# Patient Record
Sex: Male | Born: 1990 | Race: Black or African American | Hispanic: No | Marital: Single | Smoking: Current every day smoker
Health system: Southern US, Community
[De-identification: ages and names within clinical notes are randomized; demographics above are authoritative.]

---
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
## 2012-03-29 ENCOUNTER — Emergency Department: Payer: Self-pay | Admitting: Emergency Medicine

## 2012-07-23 ENCOUNTER — Emergency Department: Payer: Self-pay | Admitting: Urology

## 2012-07-23 LAB — RAPID INFLUENZA A&B ANTIGENS

## 2012-10-17 ENCOUNTER — Emergency Department: Payer: Self-pay | Admitting: Emergency Medicine

## 2015-09-09 ENCOUNTER — Emergency Department: Payer: Self-pay

## 2015-09-09 ENCOUNTER — Encounter: Payer: Self-pay | Admitting: Emergency Medicine

## 2015-09-09 ENCOUNTER — Emergency Department
Admission: EM | Admit: 2015-09-09 | Discharge: 2015-09-09 | Disposition: A | Discharge status: Home / Self Care | Payer: Self-pay | Attending: Emergency Medicine | Admitting: Emergency Medicine

## 2015-09-09 DIAGNOSIS — W34010A Accidental discharge of airgun, initial encounter: Secondary | ICD-10-CM | POA: Insufficient documentation

## 2015-09-09 DIAGNOSIS — Y998 Other external cause status: Secondary | ICD-10-CM | POA: Insufficient documentation

## 2015-09-09 DIAGNOSIS — Y9389 Activity, other specified: Secondary | ICD-10-CM | POA: Insufficient documentation

## 2015-09-09 DIAGNOSIS — Y249XXA Unspecified firearm discharge, undetermined intent, initial encounter: Secondary | ICD-10-CM

## 2015-09-09 DIAGNOSIS — W3400XA Accidental discharge from unspecified firearms or gun, initial encounter: Secondary | ICD-10-CM

## 2015-09-09 DIAGNOSIS — F17210 Nicotine dependence, cigarettes, uncomplicated: Secondary | ICD-10-CM | POA: Insufficient documentation

## 2015-09-09 DIAGNOSIS — Y9289 Other specified places as the place of occurrence of the external cause: Secondary | ICD-10-CM | POA: Insufficient documentation

## 2015-09-09 DIAGNOSIS — S91331A Puncture wound without foreign body, right foot, initial encounter: Secondary | ICD-10-CM

## 2015-09-09 MED ORDER — CEPHALEXIN 500 MG PO CAPS: 500.0000 mg | ORAL_CAPSULE | Freq: Three times a day (TID) | ORAL | Status: DC

## 2015-09-09 MED ORDER — HYDROCODONE-ACETAMINOPHEN 5-325 MG PO TABS: 1.0000 | ORAL_TABLET | ORAL | Status: DC | PRN

## 2015-09-09 MED ORDER — HYDROCODONE-ACETAMINOPHEN 5-325 MG PO TABS
2.0000 | ORAL_TABLET | Freq: Once | ORAL | Status: AC
Start: 2015-09-09 — End: 2015-09-09
  Administered 2015-09-09: 2 via ORAL
  Filled 2015-09-09: qty 2
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
## 2015-09-09 NOTE — Discharge Instructions (Signed)
Puncture Wound °A puncture wound is an injury that is caused by a sharp, thin object that goes through your skin, such as a nail. A puncture wound usually does not leave a large opening in your skin, so it may not bleed a lot. However, when you get a puncture wound, dirt or other materials (foreign bodies) can be forced into your wound and break off inside. This makes it more likely that an infection will happen, such as tetanus. °HOME CARE °Medicines  °· Take or apply over-the-counter and prescription medicines only as told by your doctor. °· If you were prescribed an antibiotic medicine, take or apply it as told by your doctor. Do not stop using the antibiotic even if your condition starts to get better. °Wound Care  °· There are many ways to close and cover a wound. For example, a wound can be covered with stitches (sutures), skin glue, or adhesive strips. Follow instructions from your doctor about: °¨ How to take care of your wound. °¨ When and how you should change your bandage (dressing). °¨ When you should remove your bandage. °¨ Removing whatever was used to close your wound. °· Keep the bandage dry as told by your doctor. Do not take baths, swim, use a hot tub, or do anything that would put your wound underwater until your doctor says it is okay. °· Clean the wound as told by your doctor. °· Do not scratch or pick at the wound. °· Check your wound every day for signs of infection. Watch for: °¨ Redness, swelling, or pain. °¨ Fluid, blood, or pus. °General Instructions  °· Raise (elevate) the injured area above the level of your heart while you are sitting or lying down. °· If your puncture wound is in your foot, ask your doctor if you need to avoid putting weight on your foot and for how long. °· Keep all follow-up visits as told by your doctor. This is important. °GET HELP IF: °· You got a tetanus shot and you have any of these problems at the injection site: °¨ Swelling. °¨ Very bad  pain. °¨ Redness. °¨ Bleeding. °· You have a fever. °· Your stitches come out. °· You notice a bad smell coming from your wound or your bandage. °· You notice something coming out of the wound, such as wood or glass. °· Medicine does not help your pain. °· You have more redness, swelling, or pain at the site of your wound. °· You have fluid, blood, or pus coming from your wound. °· You notice a change in the color of your skin near your wound. °· You need to change the bandage often because fluid, blood, or pus is coming from the wound. °· You start to have a new rash. °· You start to have numbness around the wound. °GET HELP RIGHT AWAY IF: °· You have very bad swelling around the wound. °· Your pain suddenly gets worse and is very bad. °· You start to get painful skin lumps. °· You have a red streak going away from your wound. °· The wound is on your hand or foot and you cannot move a finger or toe like you usually can. °· The wound is on your hand or foot and you notice that your fingers or toes look pale or bluish. °  °This information is not intended to replace advice given to you by your health care provider. Make sure you discuss any questions you have with your health care provider. °  °Document   Released: 03/26/2008 Document Revised: 03/08/2015 Document Reviewed: 08/10/2014 Elsevier Interactive Patient Education 2016 [employer]   Soak her foot in warm soapy water twice a day and watch for signs of infection. Begin medication today. Keflex for infection prevention and Norco as needed for pain. Elevate foot to prevent swelling and decreased pain. Follow-up with Dr. Ether GriffinsFowler if any continued problems.
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
## 2015-09-09 NOTE — ED Notes (Signed)
Per flex provider, pt's foot is soaking in a half betadine half sterile water solution for cleaning. RN has been notified
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
## 2015-09-30 ENCOUNTER — Emergency Department
Admission: EM | Admit: 2015-09-30 | Discharge: 2015-09-30 | Disposition: A | Discharge status: Home / Self Care | Payer: Self-pay | Attending: Emergency Medicine | Admitting: Emergency Medicine

## 2015-09-30 ENCOUNTER — Encounter: Payer: Self-pay | Admitting: Emergency Medicine

## 2015-09-30 DIAGNOSIS — F17210 Nicotine dependence, cigarettes, uncomplicated: Secondary | ICD-10-CM | POA: Insufficient documentation

## 2015-09-30 DIAGNOSIS — K0889 Other specified disorders of teeth and supporting structures: Secondary | ICD-10-CM | POA: Insufficient documentation

## 2015-09-30 DIAGNOSIS — K029 Dental caries, unspecified: Secondary | ICD-10-CM | POA: Insufficient documentation

## 2015-09-30 DIAGNOSIS — Z792 Long term (current) use of antibiotics: Secondary | ICD-10-CM | POA: Insufficient documentation

## 2015-09-30 DIAGNOSIS — K0381 Cracked tooth: Secondary | ICD-10-CM | POA: Insufficient documentation

## 2015-09-30 MED ORDER — OXYCODONE-ACETAMINOPHEN 5-325 MG PO TABS
1.0000 | ORAL_TABLET | Freq: Once | ORAL | Status: AC
  Administered 2015-09-30: 1 via ORAL
  Filled 2015-09-30: qty 1

## 2015-09-30 MED ORDER — IBUPROFEN 800 MG PO TABS
800.0000 mg | ORAL_TABLET | Freq: Once | ORAL | Status: AC
  Administered 2015-09-30: 800 mg via ORAL

## 2015-09-30 MED ORDER — IBUPROFEN 800 MG PO TABS
ORAL_TABLET | ORAL | Status: AC
  Administered 2015-09-30: 800 mg via ORAL
  Filled 2015-09-30: qty 1

## 2015-09-30 MED ORDER — IBUPROFEN 800 MG PO TABS: 800.0000 mg | ORAL_TABLET | Freq: Three times a day (TID) | ORAL | Status: DC | PRN

## 2015-09-30 MED ORDER — AMOXICILLIN 500 MG PO CAPS: 500.0000 mg | ORAL_CAPSULE | Freq: Three times a day (TID) | ORAL | Status: DC

## 2015-09-30 MED ORDER — IBUPROFEN 100 MG/5ML PO SUSP
800.0000 mg | Freq: Once | ORAL | Status: DC
  Filled 2015-09-30: qty 40

## 2015-09-30 MED ORDER — TRAMADOL HCL 50 MG PO TABS
50.0000 mg | ORAL_TABLET | Freq: Four times a day (QID) | ORAL | Status: AC | PRN
Start: 2015-09-30 — End: 2016-09-29

## 2015-09-30 NOTE — Discharge Instructions (Signed)
Follow-up from list of dental clinics provided °

## 2015-09-30 NOTE — ED Notes (Signed)
Discussed discharge instructions, prescriptions, and follow-up care with patient. No questions or concerns at this time. Pt stable at discharge.  

## 2015-09-30 NOTE — ED Provider Notes (Signed)
[facility] Emergency Department Provider Note  ____________________________________________  Time seen: Approximately 8:34 AM  I have reviewed the triage vital signs and the nursing notes.   HISTORY  Chief Complaint Dental Pain    HPI Cory Chan is a 25 y.o. male patient complaining of dental pain for 4 days secondary to devitalize left molar. He is rating his pain as a 10 over 10. Patient described a pain as "sharp". No palliative measures taken for this complaint. Patient denies any fever or swelling associated with this complaint.   No past medical history on file.  There are no active problems to display for this patient.   History reviewed. No pertinent past surgical history.  Current Outpatient Rx  Name  Route  Sig  Dispense  Refill  . amoxicillin (AMOXIL) 500 MG capsule   Oral   Take 1 capsule (500 mg total) by mouth 3 (three) times daily.   30 capsule   0   . cephALEXin (KEFLEX) 500 MG capsule   Oral   Take 1 capsule (500 mg total) by mouth 3 (three) times daily.   21 capsule   0   . HYDROcodone-acetaminophen (NORCO/VICODIN) 5-325 MG tablet   Oral   Take 1 tablet by mouth every 4 (four) hours as needed for moderate pain.   20 tablet   0   . ibuprofen (ADVIL,MOTRIN) 800 MG tablet   Oral   Take 1 tablet (800 mg total) by mouth every 8 (eight) hours as needed.   30 tablet   0   . traMADol (ULTRAM) 50 MG tablet   Oral   Take 1 tablet (50 mg total) by mouth every 6 (six) hours as needed.   20 tablet   0     Allergies Review of patient's allergies indicates no known allergies.  History reviewed. No pertinent family history.  Social History Social History  Substance Use Topics  . Smoking status: Current Every Day Smoker -- 0.25 packs/day    Types: Cigarettes  . Smokeless tobacco: None  . Alcohol Use: No    Review of Systems Constitutional: No fever/chills Eyes: No visual changes. ENT: No sore throat. Dental  pain Cardiovascular: Denies chest pain. Respiratory: Denies shortness of breath. Gastrointestinal: No abdominal pain.  No nausea, no vomiting.  No diarrhea.  No constipation. Genitourinary: Negative for dysuria. Musculoskeletal: Negative for back pain. Skin: Negative for rash. Neurological: Negative for headaches, focal weakness or numbness.   ____________________________________________   PHYSICAL EXAM:  VITAL SIGNS: ED Triage Vitals  Enc Vitals Group     BP 09/30/15 0812 121/90 mmHg     Pulse Rate 09/30/15 0812 68     Resp 09/30/15 0812 20     Temp 09/30/15 0812 98.1 F (36.7 C)     Temp Source 09/30/15 0812 Oral     SpO2 09/30/15 0812 98 %     Weight 09/30/15 0812 180 lb (81.647 kg)     Height 09/30/15 0812  (1.753 m)     Head Cir --      Peak Flow --      Pain Score --      Pain Loc --      Pain Edu? --      Excl. in GC? --     Constitutional: Alert and oriented. Well appearing and in no acute distress. Eyes: Conjunctivae are normal. PERRL. EOMI. Head: Atraumatic. Nose: No congestion/rhinnorhea. Mouth/Throat: Mucous membranes are moist.  Oropharynx non-erythematous. Fractured tooth #18. Neck:  No stridor.  No cervical spine tenderness to palpation. Hematological/Lymphatic/Immunilogical: No cervical lymphadenopathy. Cardiovascular: Normal rate, regular rhythm. Grossly normal heart sounds.  Good peripheral circulation. Respiratory: Normal respiratory effort.  No retractions. Lungs CTAB. Gastrointestinal: Soft and nontender. No distention. No abdominal bruits. No CVA tenderness. Musculoskeletal: No lower extremity tenderness nor edema.  No joint effusions. Neurologic:  Normal speech and language. No gross focal neurologic deficits are appreciated. No gait instability. Skin:  Skin is warm, dry and intact. No rash noted. Psychiatric: Mood and affect are normal. Speech and behavior are normal.  ____________________________________________   LABS (all labs  ordered are listed, but only abnormal results are displayed)  Labs Reviewed - No data to display ____________________________________________  EKG   ____________________________________________  RADIOLOGY   ____________________________________________   PROCEDURES  Procedure(s) performed: None  Critical Care performed: No  ____________________________________________   INITIAL IMPRESSION / ASSESSMENT AND PLAN / ED COURSE  Pertinent labs & imaging results that were available during my care of the patient were reviewed by me and considered in my medical decision making (see chart for details).  Dental pain secondary to fractured tooth left lower molar. Patient given discharge care instructions to follow-up with [facility]. Patient given prescription for amoxicillin, tramadol, and ibuprofen. ____________________________________________   FINAL CLINICAL IMPRESSION(S) / ED DIAGNOSES  Final diagnoses:  Pain due to dental caries      Joni Reiningonald K Lachanda Buczek, PA-C 09/30/15 [id number]  Arnaldo NatalPaul F Malinda, MD 09/30/15 [phone]

## 2015-09-30 NOTE — ED Notes (Signed)
Reports toothache x 4 days.  No resp distress.

## 2016-07-12 ENCOUNTER — Encounter: Payer: Self-pay | Admitting: Emergency Medicine

## 2016-07-12 ENCOUNTER — Emergency Department
Admission: EM | Admit: 2016-07-12 | Discharge: 2016-07-13 | Disposition: A | Discharge status: Home / Self Care | Payer: Self-pay | Attending: Emergency Medicine | Admitting: Emergency Medicine

## 2016-07-12 DIAGNOSIS — K529 Noninfective gastroenteritis and colitis, unspecified: Secondary | ICD-10-CM | POA: Insufficient documentation

## 2016-07-12 DIAGNOSIS — Z87891 Personal history of nicotine dependence: Secondary | ICD-10-CM | POA: Insufficient documentation

## 2016-07-12 LAB — CBC
HEMATOCRIT: 47.5 % (ref 40.0–52.0)
Hemoglobin: 15.7 g/dL (ref 13.0–18.0)
MCH: 26.7 pg (ref 26.0–34.0)
MCHC: 33 g/dL (ref 32.0–36.0)
MCV: 80.8 fL (ref 80.0–100.0)
Platelets: 290 10*3/uL (ref 150–440)
RBC: 5.88 MIL/uL (ref 4.40–5.90)
RDW: 13.7 % (ref 11.5–14.5)
WBC: 11.3 10*3/uL — ABNORMAL HIGH (ref 3.8–10.6)

## 2016-07-12 LAB — URINALYSIS, COMPLETE (UACMP) WITH MICROSCOPIC
BACTERIA UA: NONE SEEN
BILIRUBIN URINE: NEGATIVE
Glucose, UA: NEGATIVE mg/dL
Hgb urine dipstick: NEGATIVE
Ketones, ur: NEGATIVE mg/dL
LEUKOCYTES UA: NEGATIVE
Nitrite: NEGATIVE
PROTEIN: NEGATIVE mg/dL
SPECIFIC GRAVITY, URINE: 1.023 (ref 1.005–1.030)
pH: 7 (ref 5.0–8.0)

## 2016-07-12 LAB — COMPREHENSIVE METABOLIC PANEL
ALBUMIN: 4.2 g/dL (ref 3.5–5.0)
ALT: 31 U/L (ref 17–63)
AST: 22 U/L (ref 15–41)
Alkaline Phosphatase: 90 U/L (ref 38–126)
Anion gap: 10 (ref 5–15)
BUN: 9 mg/dL (ref 6–20)
CHLORIDE: 106 mmol/L (ref 101–111)
CO2: 23 mmol/L (ref 22–32)
Calcium: 9.1 mg/dL (ref 8.9–10.3)
Creatinine, Ser: 0.93 mg/dL (ref 0.61–1.24)
GFR calc Af Amer: >60 mL/min (ref >60)
GFR calc non Af Amer: >60 mL/min (ref >60)
GLUCOSE: 116 mg/dL — AB (ref 65–99)
POTASSIUM: 3.8 mmol/L (ref 3.5–5.1)
Sodium: 139 mmol/L (ref 135–145)
Total Bilirubin: 0.6 mg/dL (ref 0.3–1.2)
Total Protein: 8 g/dL (ref 6.5–8.1)

## 2016-07-12 LAB — LIPASE, BLOOD: LIPASE: 14 U/L (ref 11–51)

## 2016-07-12 MED ORDER — SODIUM CHLORIDE 0.9 % IV BOLUS (SEPSIS)
1000.0000 mL | Freq: Once | INTRAVENOUS | Status: AC
  Administered 2016-07-13: 1000 mL via INTRAVENOUS

## 2016-07-12 NOTE — ED Triage Notes (Signed)
Pt ambulatory to triage in NAD, report abd pain and vomiting and diarrhea x 6 hours.  Reports diarrhea all day, too many times to count.  Pt reports abd pain like cramping.

## 2016-07-13 MED ORDER — ONDANSETRON 4 MG PO TBDP: 4.0000 mg | ORAL_TABLET | Freq: Three times a day (TID) | ORAL | 0 refills | Status: DC | PRN

## 2016-07-13 MED ORDER — LOPERAMIDE HCL 2 MG PO CAPS
4.0000 mg | ORAL_CAPSULE | Freq: Once | ORAL | Status: AC
  Administered 2016-07-13: 4 mg via ORAL
  Filled 2016-07-13: qty 2

## 2016-07-13 MED ORDER — SODIUM CHLORIDE 0.9 % IV BOLUS (SEPSIS)
1000.0000 mL | Freq: Once | INTRAVENOUS | Status: AC
  Administered 2016-07-13: 1000 mL via INTRAVENOUS

## 2016-07-13 MED ORDER — ONDANSETRON HCL 4 MG/2ML IJ SOLN
4.0000 mg | Freq: Once | INTRAMUSCULAR | Status: AC
  Administered 2016-07-13: 4 mg via INTRAVENOUS
  Filled 2016-07-13: qty 2

## 2016-07-13 MED ORDER — SODIUM CHLORIDE 0.9 % IV BOLUS (SEPSIS): 1000.0000 mL | Freq: Once | INTRAVENOUS | Status: DC

## 2016-07-13 NOTE — ED Provider Notes (Signed)
[facility] Emergency Department Provider Note    First MD Initiated Contact with Patient 07/13/16 2345     (approximate)  I have reviewed the triage vital signs and the nursing notes.   HISTORY  Chief Complaint Abdominal Pain and Emesis   HPI Cory Chan is a 26 y.o. male presents with acute onset of vomiting and diarrhea following eating a hot dog today. Patient states that he's had multiple episodes of nonbloody emesis and diarrhea. Patient also admits to abdominal cramping. Patient states symptoms started abruptly shortly after eating a hot dog.   Past medical history None There are no active problems to display for this patient.   Past surgical history None  Prior to Admission medications   Medication Sig Start Date End Date Taking? Authorizing Provider  amoxicillin (AMOXIL) 500 MG capsule Take 1 capsule (500 mg total) by mouth 3 (three) times daily. 09/30/15   Joni Reiningonald K Smith, PA-C  cephALEXin (KEFLEX) 500 MG capsule Take 1 capsule (500 mg total) by mouth 3 (three) times daily. 09/09/15   Tommi Rumpshonda L Summers, PA-C  HYDROcodone-acetaminophen (NORCO/VICODIN) 5-325 MG tablet Take 1 tablet by mouth every 4 (four) hours as needed for moderate pain. 09/09/15   Tommi Rumpshonda L Summers, PA-C  ibuprofen (ADVIL,MOTRIN) 800 MG tablet Take 1 tablet (800 mg total) by mouth every 8 (eight) hours as needed. 09/30/15   Joni Reiningonald K Smith, PA-C  traMADol (ULTRAM) 50 MG tablet Take 1 tablet (50 mg total) by mouth every 6 (six) hours as needed. 09/30/15 09/29/16  Joni Reiningonald K Smith, PA-C    Allergies Patient has no known allergies.  History reviewed. No pertinent family history.  Social History Social History  Substance Use Topics  . Smoking status: Former Smoker    Packs/day: 0.25    Types: Cigarettes  . Smokeless tobacco: Never Used  . Alcohol use No    Review of Systems Constitutional: No fever/chills Eyes: No visual changes. ENT: No sore throat. Cardiovascular: Denies  chest pain. Respiratory: Denies shortness of breath. Gastrointestinal: No abdominal pain.  No nausea, no vomiting.  No diarrhea.  No constipation. Genitourinary: Negative for dysuria. Musculoskeletal: Negative for back pain. Skin: Negative for rash. Neurological: Negative for headaches, focal weakness or numbness.  10-point ROS otherwise negative.  ____________________________________________   PHYSICAL EXAM:  VITAL SIGNS: ED Triage Vitals  Enc Vitals Group     BP 07/12/16 2251 132/81     Pulse Rate 07/12/16 2251 86     Resp 07/12/16 2251 14     Temp 07/12/16 2251 97.5 F (36.4 C)     Temp Source 07/12/16 2251 Oral     SpO2 07/12/16 2251 99 %     Weight 07/12/16 2251 180 lb (81.6 kg)     Height 07/12/16 2251 5\' 9"  (1.753 m)     Head Circumference --      Peak Flow --      Pain Score 07/12/16 2254 8     Pain Loc --      Pain Edu? --      Excl. in GC? --      Constitutional: Alert and oriented. Well appearing and in no acute distress. Eyes: Conjunctivae are normal. PERRL. EOMI. Head: Atraumatic. Mouth/Throat: Mucous membranes are moist.  Oropharynx non-erythematous. Neck: No stridor.   Cardiovascular: Normal rate, regular rhythm. Good peripheral circulation. Grossly normal heart sounds. Respiratory: Normal respiratory effort.  No retractions. Lungs CTAB. Gastrointestinal: Soft and nontender. No distention.  Musculoskeletal: No lower extremity tenderness  nor edema. No gross deformities of extremities. Neurologic:  Normal speech and language. No gross focal neurologic deficits are appreciated.  Skin:  Skin is warm, dry and intact. No rash noted. Psychiatric: Mood and affect are normal. Speech and behavior are normal.  ____________________________________________   LABS (all labs ordered are listed, but only abnormal results are displayed)  Labs Reviewed  COMPREHENSIVE METABOLIC PANEL - Abnormal; Notable for the following:       Result Value   Glucose, Bld 116 (*)     All other components within normal limits  CBC - Abnormal; Notable for the following:    WBC 11.3 (*)    All other components within normal limits  URINALYSIS, COMPLETE (UACMP) WITH MICROSCOPIC - Abnormal; Notable for the following:    Color, Urine YELLOW (*)    APPearance CLEAR (*)    Squamous Epithelial / LPF 0-5 (*)    All other components within normal limits  GASTROINTESTINAL PANEL BY PCR, STOOL (REPLACES STOOL CULTURE)  LIPASE, BLOOD     Procedures    INITIAL IMPRESSION / ASSESSMENT AND PLAN / ED COURSE  Pertinent labs & imaging results that were available during my care of the patient were reviewed by me and considered in my medical decision making (see chart for details).  History physical exam consistent with possible gastroenteritis as such patient given 2 L IV normal saline 4 mg IV Zofran. Patient had no further diarrhea while in the emergency department. As of abdominal pain with palpation no imaging of the abdomen was performed   Clinical Course     ____________________________________________  FINAL CLINICAL IMPRESSION(S) / ED DIAGNOSES  Final diagnoses:  Gastroenteritis     MEDICATIONS GIVEN DURING THIS VISIT:  Medications  sodium chloride 0.9 % bolus 1,000 mL (not administered)  sodium chloride 0.9 % bolus 1,000 mL (not administered)  ondansetron (ZOFRAN) injection 4 mg (not administered)  sodium chloride 0.9 % bolus 1,000 mL (not administered)     NEW OUTPATIENT MEDICATIONS STARTED DURING THIS VISIT:  New Prescriptions   No medications on file    Modified Medications   No medications on file    Discontinued Medications   No medications on file     Note:  This document was prepared using Dragon voice recognition software and may include unintentional dictation errors.    Darci Current, MD 07/13/16 [phone]

## 2017-08-12 ENCOUNTER — Emergency Department
Admission: EM | Admit: 2017-08-12 | Discharge: 2017-08-12 | Disposition: A | Discharge status: Home / Self Care | Payer: Self-pay | Attending: Emergency Medicine | Admitting: Emergency Medicine

## 2017-08-12 ENCOUNTER — Other Ambulatory Visit: Payer: Self-pay

## 2017-08-12 DIAGNOSIS — R51 Headache: Secondary | ICD-10-CM | POA: Insufficient documentation

## 2017-08-12 DIAGNOSIS — R519 Headache, unspecified: Secondary | ICD-10-CM

## 2017-08-12 DIAGNOSIS — Z87891 Personal history of nicotine dependence: Secondary | ICD-10-CM | POA: Insufficient documentation

## 2017-08-12 MED ORDER — KETOROLAC TROMETHAMINE 30 MG/ML IJ SOLN
30.0000 mg | Freq: Once | INTRAMUSCULAR | Status: AC
  Administered 2017-08-12: 30 mg via INTRAMUSCULAR
  Filled 2017-08-12: qty 1

## 2017-08-12 MED ORDER — BUTALBITAL-APAP-CAFFEINE 50-325-40 MG PO TABS: 1.0000 | ORAL_TABLET | Freq: Four times a day (QID) | ORAL | 0 refills | Status: AC | PRN

## 2017-08-12 NOTE — ED Provider Notes (Signed)
[facility] Emergency Department Provider Note   ____________________________________________    I have reviewed the triage vital signs and the nursing notes.   HISTORY  Chief Complaint Headache     HPI Cory Chan is a 27 y.o. male who presents with complaints of a frontal headache which has been ongoing over the last 3 days.  Patient reports moderate throbbing sensation in the front of his head.  No photosensitivity.  Does report a history of migraines when he was younger.  No nausea or vomiting.  No head injury.  No neck pain.  No neuro deficits.  No change in vision.  No fever.  Took an Advil yesterday with resolution of pain but then he states the headache came back.  Has not taken any more medication   History reviewed. No pertinent past medical history.  There are no active problems to display for this patient.   History reviewed. No pertinent surgical history.  Prior to Admission medications   Medication Sig Start Date End Date Taking? Authorizing Provider  amoxicillin (AMOXIL) 500 MG capsule Take 1 capsule (500 mg total) by mouth 3 (three) times daily. 09/30/15   Joni Reining, PA-C  butalbital-acetaminophen-caffeine (FIORICET, ESGIC) [phone] MG tablet Take 1-2 tablets by mouth every 6 (six) hours as needed for headache. 08/12/17 08/12/18  Jene Every, MD  cephALEXin (KEFLEX) 500 MG capsule Take 1 capsule (500 mg total) by mouth 3 (three) times daily. 09/09/15   Tommi Rumps, PA-C  HYDROcodone-acetaminophen (NORCO/VICODIN) 5-325 MG tablet Take 1 tablet by mouth every 4 (four) hours as needed for moderate pain. 09/09/15   Tommi Rumps, PA-C  ibuprofen (ADVIL,MOTRIN) 800 MG tablet Take 1 tablet (800 mg total) by mouth every 8 (eight) hours as needed. 09/30/15   Joni Reining, PA-C  ondansetron (ZOFRAN ODT) 4 MG disintegrating tablet Take 1 tablet (4 mg total) by mouth every 8 (eight) hours as needed for nausea or vomiting. 07/13/16    Darci Current, MD     Allergies Patient has no known allergies.  No family history on file.  Social History Social History   Tobacco Use  . Smoking status: Former Smoker    Packs/day: 0.25    Types: Cigarettes  . Smokeless tobacco: Never Used  Substance Use Topics  . Alcohol use: No  . Drug use: Yes    Types: Marijuana    Comment: Daily    Review of Systems  Constitutional: No fevers Eyes: No visual changes.  ENT: No neck pain Cardiovascular: No palpitations Resp: No cough  Gastrointestinal:   No nausea, no vomiting.   Genitourinary: Negative for dysuria. Musculoskeletal: Negative for joint pain Skin: Negative for rash. Neurological: Negative for focal weakness   ____________________________________________   PHYSICAL EXAM:  VITAL SIGNS: ED Triage Vitals [08/12/17 0848]  Enc Vitals Group     BP 138/88     Pulse Rate 66     Resp 17     Temp 98.2 F (36.8 C)     Temp Source Oral     SpO2 98 %     Weight 86.2 kg (190 lb)     Height 1.753 m (5\' 9" )     Head Circumference      Peak Flow      Pain Score 8     Pain Loc      Pain Edu?      Excl. in GC?     Constitutional: Alert and oriented. No  acute distress. Pleasant and interactive Eyes: Conjunctivae are normal.  PERRLA, EOMI Head: Atraumatic.   Neck:  Painless ROM Cardiovascular: Normal rate, regular rhythm.   Good peripheral circulation. Respiratory: Normal respiratory effort.  No retractions.  Gastrointestinal: Soft and nontender. No distention.  Genitourinary: deferred Musculoskeletal:  Warm and well perfused Neurologic:  Normal speech and language. No gross focal neurologic deficits are appreciated.  Skin:  Skin is warm, dry and intact. No rash noted. Psychiatric: Mood and affect are normal. Speech and behavior are normal.  ____________________________________________   LABS (all labs ordered are listed, but only abnormal results are displayed)  Labs Reviewed - No data to  display ____________________________________________  EKG  None ____________________________________________  RADIOLOGY  None ____________________________________________   PROCEDURES  Procedure(s) performed: No  Procedures   Critical Care performed: No ____________________________________________   INITIAL IMPRESSION / ASSESSMENT AND PLAN / ED COURSE  Pertinent labs & imaging results that were available during my care of the patient were reviewed by me and considered in my medical decision making (see chart for details).  Patient well-appearing in no acute distress.  Concerning symptoms on HPI or findings on exam.  Will treat with IM Toradol and reevaluate but suspect outpatient treatment is appropriate    ____________________________________________   FINAL CLINICAL IMPRESSION(S) / ED DIAGNOSES  Final diagnoses:  Acute nonintractable headache, unspecified headache type        Note:  This document was prepared using Dragon voice recognition software and may include unintentional dictation errors.    Jene EveryKinner, Ayomide Zuleta, MD 08/12/17 [phone]

## 2017-08-12 NOTE — ED Notes (Signed)
Patient ambulatory to triage with steady gait and NAD noted. Patient verbalized understanding of discharge instructions and follow-up care.

## 2017-08-12 NOTE — ED Notes (Signed)
Patient ambulatory to Room 2, Cory RiegerLaura RN aware of placement in room.

## 2017-08-12 NOTE — ED Triage Notes (Signed)
Pt c/o frontal HA for the past 3 days, states he took advil yesterday and it went away but came back.

## 2018-02-21 ENCOUNTER — Encounter: Payer: Self-pay | Admitting: Emergency Medicine

## 2018-02-21 ENCOUNTER — Emergency Department
Admission: EM | Admit: 2018-02-21 | Discharge: 2018-02-21 | Disposition: A | Discharge status: Home / Self Care | Payer: Self-pay | Attending: Emergency Medicine | Admitting: Emergency Medicine

## 2018-02-21 ENCOUNTER — Other Ambulatory Visit: Payer: Self-pay

## 2018-02-21 DIAGNOSIS — K047 Periapical abscess without sinus: Secondary | ICD-10-CM | POA: Insufficient documentation

## 2018-02-21 DIAGNOSIS — Z79899 Other long term (current) drug therapy: Secondary | ICD-10-CM | POA: Insufficient documentation

## 2018-02-21 DIAGNOSIS — Z87891 Personal history of nicotine dependence: Secondary | ICD-10-CM | POA: Insufficient documentation

## 2018-02-21 MED ORDER — AMOXICILLIN 875 MG PO TABS: 875.0000 mg | ORAL_TABLET | Freq: Two times a day (BID) | ORAL | 0 refills | Status: DC

## 2018-02-21 MED ORDER — TRAMADOL HCL 50 MG PO TABS: 50.0000 mg | ORAL_TABLET | Freq: Four times a day (QID) | ORAL | 0 refills | Status: DC | PRN

## 2018-02-21 MED ORDER — MAGIC MOUTHWASH W/LIDOCAINE: 5.0000 mL | Freq: Four times a day (QID) | ORAL | 0 refills | Status: DC

## 2018-02-21 NOTE — Discharge Instructions (Signed)
OPTIONS FOR DENTAL FOLLOW UP CARE ° °[place] Department of Health and Human Services - Local Safety Net Dental Clinics °http://www.ncdhhs.gov/dph/oralhealth/services/safetynetclinics.htm °  °[facility] (336-562-3123) ° °[facility] [place] (919-933-9087) ° °[facility] ([phone] ext [phone]) ° °[facility] ([phone]) ° °[facility] ([phone]) °This clinic caters to the indigent population and is on a lottery system. °Location: °[facility], [address], [place] °Clinic Hours: °Wednesdays from 6pm - 9pm, patients seen by a lottery system. °For dates, call or go to [email or web address]/[facility]/patients/[email or web address] °Services: °Cleanings, fillings and simple extractions. °Payment Options: °DENTAL WORK IS FREE OF CHARGE. Bring proof of income or support. °Best way to get seen: °Arrive at 5:15 pm - this is a lottery, NOT first come/first serve, so arriving earlier will not increase your chances of being seen. °  °  °[facility] °[phone] °Select option 1 for emergencies °  °Location: °[facility], [address], [place] °Clinic Hours: °No walk-ins accepted - call the day before to schedule an appointment. °Check in times are 9:30 am and 1:30 pm. °Services: °Simple extractions, temporary fillings, pulpectomy/pulp debridement, uncomplicated abscess drainage. °Payment Options: °PAYMENT IS DUE AT THE TIME OF SERVICE.  Fee is usually $100-200, additional surgical procedures (e.g. abscess drainage) may be extra. °Cash, checks, Visa/MasterCard accepted.  Can file Medicaid if patient is covered for dental - patient should call case worker to check. °No discount for [facility] patients. °Best way to get seen: °MUST call the day before and get onto the schedule. Can usually be seen the next 1-2 days. No walk-ins accepted. °  °  °[facility] °[phone] °   °Location: °[facility], [address], [place] °Clinic Hours: °M, W, Th, F 8am or 1:30pm, Tues 9a or 1:30 - first come/first served. °Services: °Simple extractions, temporary fillings, uncomplicated abscess drainage.  You do not need to be an [place] resident. °Payment Options: °PAYMENT IS DUE AT THE TIME OF SERVICE. °Dental insurance, otherwise sliding scale - bring proof of income or support. °Depending on income and treatment needed, cost is usually $50-200. °Best way to get seen: °Arrive early as it is first come/first served. °  °  °[facility] °[phone] °  °Location: °[address] °Clinic Hours: °Mon-Thu 8a-5p °Services: °Most basic dental services including extractions and fillings. °Payment Options: °PAYMENT IS DUE AT THE TIME OF SERVICE. °Sliding scale, up to 50% off - bring proof if income or support. °Medicaid with dental option accepted. °Best way to get seen: °Call to schedule an appointment, can usually be seen within 2 weeks OR they will try to see walk-ins - show up at 8a or 2p (you may have to wait). °  °  °[facility] °[phone] °[place] RESIDENTS ONLY °  °Location: °[facility], [address], [place], [place] [address] °Clinic Hours: By appointment only. °Monday - Thursday 8am-5pm, Friday 8am-12pm °Services: Cleanings, fillings, extractions. °Payment Options: °PAYMENT IS DUE AT THE TIME OF SERVICE. °Cash, Visa or MasterCard. Sliding scale - $30 minimum per service. °Best way to get seen: °Come in to office, complete packet and make an appointment - need proof of income °or support monies for each household member and proof of [place] residence. °Usually takes about a month to get in. °  °  °[facility] °[phone] °  °Location: °[address]   [place] °Clinic Hours: Walk-in Urgent Care Dental Services are offered Monday-Friday  mornings only. °The numbers of emergencies accepted daily is limited to the number of °providers available. °Maximum 15 - Mondays, Wednesdays & Thursdays °Maximum 10 - Tuesdays & Fridays °Services: °You do not need to be a [place] resident to be seen for a dental emergency. °Emergencies are defined as pain, swelling, abnormal bleeding, or dental trauma. Walkins will receive x-rays if needed. °NOTE: Dental cleaning is not an emergency. °Payment Options: °PAYMENT IS DUE AT THE TIME OF SERVICE. °Minimum co-pay is $40.00 for uninsured patients. °Minimum co-pay is $3.00 for Medicaid with dental coverage. °Dental Insurance is accepted and must be presented at time of visit. °Medicare does not cover dental. °Forms of payment: Cash, credit card, checks. °Best way to get seen: °If not previously registered with the clinic, walk-in dental registration begins at 7:15 am and is on a first come/first serve basis. °If previously registered with the clinic, call to make an appointment. °  °  °[facility] °[phone] °[place] RESIDENTS ONLY °  °Location: °[address], [place], [place] °Clinic Hours: °Mon-Thu 10a-2p °Services: Extractions only! °Payment Options: °FREE (donations accepted) - bring proof of income or support °Best way to get seen: °Call and schedule an appointment OR come at 8am on the 1st Monday of every month (except for holidays) when it is first come/first served. °  °  °[facility] °[phone] °  °Location: °[address], [place] °Clinic Hours: °Friday mornings °Services, Payment Options, Best way to get seen: °Call for info °

## 2018-02-21 NOTE — ED Triage Notes (Signed)
Pt to ED via POV c/o dental pain x 4 days. Pt states that he has been using tylenol for the pain but it is not helping. Pt is in NAD at this time.

## 2018-02-21 NOTE — ED Notes (Signed)
Pt presents to ED c/o l upper and r lower dental pain. States he has some broken. Has not seen dentist.

## 2018-02-21 NOTE — ED Provider Notes (Signed)
[facility] Emergency Department Provider Note  ____________________________________________  Time seen: Approximately 3:26 PM  I have reviewed the triage vital signs and the nursing notes.   HISTORY  Chief Complaint Dental Pain    HPI Cory Chan is a 27 y.o. male who presents the emergency department complaining of left and right dental pain for several days..  Patient reports that he has a broken tooth to the left lower dentition that the socket is becoming more painful.  He also reports to "cysts" 1 to the left upper dentition, one to the right lower dentition that are painful.  Patient reports that he does not have a dentist.  No fevers or chills, nasal congestion, sore throat.  No difficulty breathing or swallowing.  No abdominal pain, nausea or vomiting.  Patient is tried Tylenol at home but no other medications.    History reviewed. No pertinent past medical history.  There are no active problems to display for this patient.   History reviewed. No pertinent surgical history.  Prior to Admission medications   Medication Sig Start Date End Date Taking? Authorizing Provider  amoxicillin (AMOXIL) 875 MG tablet Take 1 tablet (875 mg total) by mouth 2 (two) times daily. 02/21/18   Horton Ellithorpe, Delorise RoyalsJonathan D, PA-C  butalbital-acetaminophen-caffeine (FIORICET, ESGIC) [phone] MG tablet Take 1-2 tablets by mouth every 6 (six) hours as needed for headache. 08/12/17 08/12/18  Jene EveryKinner, Robert, MD  cephALEXin (KEFLEX) 500 MG capsule Take 1 capsule (500 mg total) by mouth 3 (three) times daily. 09/09/15   Tommi RumpsSummers, Rhonda L, PA-C  HYDROcodone-acetaminophen (NORCO/VICODIN) 5-325 MG tablet Take 1 tablet by mouth every 4 (four) hours as needed for moderate pain. 09/09/15   Tommi RumpsSummers, Rhonda L, PA-C  ibuprofen (ADVIL,MOTRIN) 800 MG tablet Take 1 tablet (800 mg total) by mouth every 8 (eight) hours as needed. 09/30/15   Joni ReiningSmith, Ronald K, PA-C  magic mouthwash w/lidocaine SOLN Take 5  mLs by mouth 4 (four) times daily. 02/21/18   Karoline Fleer, Delorise RoyalsJonathan D, PA-C  ondansetron (ZOFRAN ODT) 4 MG disintegrating tablet Take 1 tablet (4 mg total) by mouth every 8 (eight) hours as needed for nausea or vomiting. 07/13/16   Darci CurrentBrown, Sturgeon N, MD  traMADol (ULTRAM) 50 MG tablet Take 1 tablet (50 mg total) by mouth every 6 (six) hours as needed. 02/21/18   Asencion Guisinger, Delorise RoyalsJonathan D, PA-C    Allergies Patient has no known allergies.  No family history on file.  Social History Social History   Tobacco Use  . Smoking status: Former Smoker    Packs/day: 0.25    Types: Cigarettes  . Smokeless tobacco: Never Used  Substance Use Topics  . Alcohol use: No  . Drug use: Yes    Types: Marijuana    Comment: Daily     Review of Systems  Constitutional: No fever/chills Eyes: No visual changes. No discharge ENT: Positive for bilateral dental pain. Cardiovascular: no chest pain. Respiratory: no cough. No SOB. Gastrointestinal: No abdominal pain.  No nausea, no vomiting.  Musculoskeletal: Negative for musculoskeletal pain. Skin: Negative for rash, abrasions, lacerations, ecchymosis. Neurological: Negative for headaches, focal weakness or numbness. 10-point ROS otherwise negative.  ____________________________________________   PHYSICAL EXAM:  VITAL SIGNS: ED Triage Vitals  Enc Vitals Group     BP 02/21/18 1437 126/78     Pulse Rate 02/21/18 1437 77     Resp 02/21/18 1437 16     Temp 02/21/18 1437 98.4 F (36.9 C)     Temp Source 02/21/18 1437  Oral     SpO2 02/21/18 1437 97 %     Weight 02/21/18 1439 180 lb (81.6 kg)     Height 02/21/18 1439 5\' 9"  (1.753 m)     Head Circumference --      Peak Flow --      Pain Score 02/21/18 1439 8     Pain Loc --      Pain Edu? --      Excl. in GC? --      Constitutional: Alert and oriented. Well appearing and in no acute distress. Eyes: Conjunctivae are normal. PERRL. EOMI. Head: Atraumatic. ENT:      Ears:       Nose: No  congestion/rhinnorhea.      Mouth/Throat: Mucous membranes are moist.  Tooth #20 is absent.  Erythema, mild edema in tooth space.  Patient has corresponding mild erythema and edema in the tooth above, #13.  Patient with no appreciable fluctuance with palpation and tongue depressor.  Patient has mild erythema below tooth #29.  No fluctuance or induration.  No drainage from any tooth.  Uvula is midline. Neck: No stridor.   Hematological/Lymphatic/Immunilogical: No cervical lymphadenopathy. Cardiovascular: Normal rate, regular rhythm. Normal S1 and S2.  Good peripheral circulation. Respiratory: Normal respiratory effort without tachypnea or retractions. Lungs CTAB. Good air entry to the bases with no decreased or absent breath sounds. Musculoskeletal: Full range of motion to all extremities. No gross deformities appreciated. Neurologic:  Normal speech and language. No gross focal neurologic deficits are appreciated.  Skin:  Skin is warm, dry and intact. No rash noted. Psychiatric: Mood and affect are normal. Speech and behavior are normal. Patient exhibits appropriate insight and judgement.   ____________________________________________   LABS (all labs ordered are listed, but only abnormal results are displayed)  Labs Reviewed - No data to display ____________________________________________  EKG   ____________________________________________  RADIOLOGY   No results found.  ____________________________________________    PROCEDURES  Procedure(s) performed:    Procedures    Medications - No data to display   ____________________________________________   INITIAL IMPRESSION / ASSESSMENT AND PLAN / ED COURSE  Pertinent labs & imaging results that were available during my care of the patient were reviewed by me and considered in my medical decision making (see chart for details).  Review of the [place] CSRS was performed in accordance of the [facility] prior to dispensing any  controlled drugs.      Patient's diagnosis is consistent with dental infection, dental pain.  Patient presents the emergency department with multiple dental complaints.  Mild signs of infection on exam.  No indication of deep space infection to include Ludwick's angina.  Patient will be treated with antibiotics, Ultram, Magic mouthwash..  Patient is to follow-up with dentist, and list is given to patient for local dental clinics.  Patient is given ED precautions to return to the ED for any worsening or new symptoms.     ____________________________________________  FINAL CLINICAL IMPRESSION(S) / ED DIAGNOSES  Final diagnoses:  Dental infection      NEW MEDICATIONS STARTED DURING THIS VISIT:  ED Discharge Orders         Ordered    amoxicillin (AMOXIL) 875 MG tablet  2 times daily     02/21/18 1535    traMADol (ULTRAM) 50 MG tablet  Every 6 hours PRN     02/21/18 1535    magic mouthwash w/lidocaine SOLN  4 times daily    Note to Pharmacy:  Dispense in a  1/1/1 ratio. Use lidocaine, diphenhydramine, prednisolone   02/21/18 1535              This chart was dictated using voice recognition software/Dragon. Despite best efforts to proofread, errors can occur which can change the meaning. Any change was purely unintentional.    Racheal Patches, PA-C 02/21/18 1536    Jene Every, MD 02/21/18 [phone]

## 2018-02-21 NOTE — ED Notes (Signed)

## 2019-04-29 ENCOUNTER — Emergency Department: Payer: Self-pay

## 2019-04-29 ENCOUNTER — Other Ambulatory Visit: Payer: Self-pay

## 2019-04-29 ENCOUNTER — Emergency Department
Admission: EM | Admit: 2019-04-29 | Discharge: 2019-04-29 | Disposition: A | Discharge status: Home / Self Care | Payer: Self-pay | Attending: Emergency Medicine | Admitting: Emergency Medicine

## 2019-04-29 ENCOUNTER — Encounter: Payer: Self-pay | Admitting: Emergency Medicine

## 2019-04-29 DIAGNOSIS — W1842XA Slipping, tripping and stumbling without falling due to stepping into hole or opening, initial encounter: Secondary | ICD-10-CM | POA: Insufficient documentation

## 2019-04-29 DIAGNOSIS — Y929 Unspecified place or not applicable: Secondary | ICD-10-CM | POA: Insufficient documentation

## 2019-04-29 DIAGNOSIS — Y999 Unspecified external cause status: Secondary | ICD-10-CM | POA: Insufficient documentation

## 2019-04-29 DIAGNOSIS — Z87891 Personal history of nicotine dependence: Secondary | ICD-10-CM | POA: Insufficient documentation

## 2019-04-29 DIAGNOSIS — S8392XA Sprain of unspecified site of left knee, initial encounter: Secondary | ICD-10-CM | POA: Insufficient documentation

## 2019-04-29 DIAGNOSIS — Y939 Activity, unspecified: Secondary | ICD-10-CM | POA: Insufficient documentation

## 2019-04-29 DIAGNOSIS — K0889 Other specified disorders of teeth and supporting structures: Secondary | ICD-10-CM | POA: Insufficient documentation

## 2019-04-29 MED ORDER — HYDROCODONE-ACETAMINOPHEN 5-325 MG PO TABS: 1.0000 | ORAL_TABLET | Freq: Four times a day (QID) | ORAL | 0 refills | Status: DC | PRN

## 2019-04-29 MED ORDER — IBUPROFEN 600 MG PO TABS: 600.0000 mg | ORAL_TABLET | Freq: Three times a day (TID) | ORAL | 0 refills | Status: DC | PRN

## 2019-04-29 MED ORDER — AMOXICILLIN 875 MG PO TABS: 875.0000 mg | ORAL_TABLET | Freq: Two times a day (BID) | ORAL | 0 refills | Status: DC

## 2019-04-29 NOTE — Discharge Instructions (Signed)
Follow-up with your primary care provider if any continued problems or concerns.  If your knee is not improving you will need follow-up with orthopedist Dr. Roland Rack is the orthopedist on call today.  His contact information is listed on your discharge papers.  Ice and elevate your knee as needed for swelling and pain.  Wear the knee immobilizer anytime you are up walking as this will prevent you from twisting your knee again.  Take medication only as directed.  The amoxicillin is twice a day for 10 days for your dental pain.  The ibuprofen is for inflammation.  The hydrocodone is for knee pain as needed.  Do not drive or operate machinery while taking the pain medication.  A list of dental clinics is listed on your discharge papers as well.   OPTIONS FOR DENTAL FOLLOW UP CARE  [facility] and [place] [email or web address] [phone])  [facility] [phone])  [place] [phone])  [place] [phone])  [facility] [phone]) This clinic caters to the indigent population and is on a lottery system. Location: [employer] [employer] [facility], [place], [place], [place] Clinic Hours: Wednesdays from 6pm - 9pm, patients seen by a lottery system. For dates, call or go to [email or web address] Services: Cleanings, fillings and simple extractions. Payment Options: DENTAL WORK IS FREE OF CHARGE. Bring proof of income or support. Best way to get seen: Arrive at 5:15 pm - this is a lottery, NOT first come/first serve, so arriving earlier will not increase your chances of being seen.     [facility] [phone] Select option 1 for emergencies   Location: [facility], [place], [address] [place] Clinic Hours: No  walk-ins accepted - call the day before to schedule an appointment. Check in times are 9:30 am and 1:30 pm. Services: Simple extractions, temporary fillings, pulpectomy/pulp debridement, uncomplicated abscess drainage. Payment Options: PAYMENT IS DUE AT THE TIME OF SERVICE.  Fee is usually $100-200, additional surgical procedures (e.g. abscess drainage) may be extra. Cash, checks, Visa/MasterCard accepted.  Can file Medicaid if patient is covered for dental - patient should call case worker to check. No discount for [facility] patients. Best way to get seen: MUST call the day before and get onto the schedule. Can usually be seen the next 1-2 days. No walk-ins accepted.     [place] [phone]   Location: [place], [place] Clinic Hours: M, W, Th, F 8am or 1:30pm, Tues 9a or 1:30 - first come/first served. Services: Simple extractions, temporary fillings, uncomplicated abscess drainage.  You do not need to be an [facility] resident. Payment Options: PAYMENT IS DUE AT THE TIME OF SERVICE. Dental insurance, otherwise sliding scale - bring proof of income or support. Depending on income and treatment needed, cost is usually $50-200. Best way to get seen: Arrive early as it is first come/first served.     [facility] [phone]   Location: [place] [facility] Hours: Mon-Thu 8a-5p Services: Most basic dental services including extractions and fillings. Payment Options: PAYMENT IS DUE AT THE TIME OF SERVICE. Sliding scale, up to 50% off - bring proof if income or support. Medicaid with dental option accepted. Best way to get seen: Call to schedule an appointment, can usually be seen within 2 weeks  OR they will try to see walk-ins - show up at 8a or 2p (you may have to wait).     [facility] [phone] [place] RESIDENTS ONLY    Location: [facility], [address] [place], [place] [address] Clinic Hours: By appointment only. Monday - Thursday 8am-5pm, Friday 8am-12pm Services: Cleanings, fillings, extractions. Payment Options: PAYMENT IS DUE AT THE TIME OF SERVICE. Cash, Visa or MasterCard. Sliding scale - $30 minimum per service. Best way to get seen: Come in to office, complete packet and make an appointment - need proof of income or support monies for each household member and proof of [facility] residence. Usually takes about a month to get in.     [facility] [phone]   Location: [address] [facility] Clinic Hours: Walk-in Urgent Care Dental Services are offered Monday-Friday mornings only. The numbers of emergencies accepted daily is limited to the number of providers available. Maximum 15 - Mondays, Wednesdays & Thursdays Maximum 10 - Tuesdays & Fridays Services: You do not need to be a [facility] resident to be seen for a dental emergency. Emergencies are defined as pain, swelling, abnormal bleeding, or dental trauma. Walkins will receive x-rays if needed. NOTE: Dental cleaning is not an emergency. Payment Options: PAYMENT IS DUE AT THE TIME OF SERVICE. Minimum co-pay is $40.00 for uninsured patients. Minimum co-pay is $3.00 for Medicaid with dental coverage. Dental Insurance is accepted and must be presented at time of visit. Medicare does not cover dental. Forms of payment: Cash, credit card, checks. Best way to get seen: If not previously registered with the clinic, walk-in dental registration begins at 7:15 am and is on a first come/first serve basis. If previously registered with the clinic, call to make an appointment.     [facility] [phone] [place] RESIDENTS ONLY   Location: [address], [place], [place] Clinic Hours: Mon-Thu 10a-2p Services: Extractions only! Payment Options: FREE  (donations accepted) - bring proof of income or support Best way to get seen: Call and schedule an appointment OR come at 8am on the 1st Monday of every month (except for holidays) when it is first come/first served.     [facility] [phone]   Location: [address] [place], [place] Clinic Hours: Friday mornings Services, Payment Options, Best way to get seen: Call for info

## 2019-04-29 NOTE — ED Notes (Signed)
See triage note  States he stepped in a hole last pm  Twisted left knee  Having pain to posterior knee  And having increased pain with ambulation    Also states he has 2 broken teeth which are causing pain

## 2019-04-29 NOTE — ED Triage Notes (Signed)
Dental pain the past few days, also c/o left knee pain from stepping in a hole last night and twisting it. Walked with limp to triage. NAD.

## 2019-04-29 NOTE — ED Provider Notes (Signed)
[facility] Emergency Department Provider Note   ____________________________________________   First MD Initiated Contact with Patient 04/29/19 1033     (approximate)  I have reviewed the triage vital signs and the nursing notes.   HISTORY  Chief Complaint Dental Pain and Knee Pain   HPI Cory Chan is a 28 y.o. male presents to the ED with complaint of left knee pain after stepping in a hole last evening.  He is also here to be seen for dental pain that has been going on for some time.  Patient denies any head injury or loss of consciousness.  He states that he fell forward when he stepped in the hole and has a small abrasion to his knee but no active bleeding.  Patient is able to ambulate but is limping in the ED.  He rates his pain as an 8 out of 10.     History reviewed. No pertinent past medical history.  There are no active problems to display for this patient.   History reviewed. No pertinent surgical history.  Prior to Admission medications   Medication Sig Start Date End Date Taking? Authorizing Provider  amoxicillin (AMOXIL) 875 MG tablet Take 1 tablet (875 mg total) by mouth 2 (two) times daily. 04/29/19   Tommi Rumps, PA-C  HYDROcodone-acetaminophen (NORCO/VICODIN) 5-325 MG tablet Take 1 tablet by mouth every 6 (six) hours as needed for moderate pain. 04/29/19   Tommi Rumps, PA-C  ibuprofen (ADVIL) 600 MG tablet Take 1 tablet (600 mg total) by mouth every 8 (eight) hours as needed. 04/29/19   Tommi Rumps, PA-C    Allergies Patient has no known allergies.  No family history on file.  Social History Social History   Tobacco Use  . Smoking status: Former Smoker    Packs/day: 0.25    Types: Cigarettes  . Smokeless tobacco: Never Used  Substance Use Topics  . Alcohol use: No  . Drug use: Yes    Types: Marijuana    Comment: Daily    Review of Systems Constitutional: No fever/chills Eyes: No visual changes.  ENT: No trauma.  Positive for dental pain. Cardiovascular: Denies chest pain. Respiratory: Denies shortness of breath. Musculoskeletal: Positive for left knee pain. Skin: Negative for rash. Neurological: Negative for headaches, focal weakness or numbness. ____________________________________________   PHYSICAL EXAM:  VITAL SIGNS: ED Triage Vitals  Enc Vitals Group     BP 04/29/19 1000 (!) 141/87     Pulse Rate 04/29/19 1000 82     Resp 04/29/19 1000 16     Temp 04/29/19 1000 98.6 F (37 C)     Temp Source 04/29/19 1000 Oral     SpO2 04/29/19 1000 95 %     Weight 04/29/19 1000 200 lb (90.7 kg)     Height 04/29/19 1000 5\' 9"  (1.753 m)     Head Circumference --      Peak Flow --      Pain Score 04/29/19 1008 8     Pain Loc --      Pain Edu? --      Excl. in GC? --    Constitutional: Alert and oriented. Well appearing and in no acute distress. Eyes: Conjunctivae are normal.  Head: Atraumatic. Mouth/Throat: Mucous membranes are moist.  Oropharynx non-erythematous.  Multiple caries present on the lower premolars.  No obvious drainage or abscess seen. Neck: No stridor.   Cardiovascular: Normal rate, regular rhythm. Grossly normal heart sounds.  Good peripheral  circulation. Respiratory: Normal respiratory effort.  No retractions. Lungs CTAB. Musculoskeletal: On examination of the left knee there is no gross deformity and no effusion noted.  Range of motion is slow and guarded.  Ligaments are stable bilaterally.  No discoloration but there is a superficial abrasion noted without foreign body or active bleeding to the anterior left knee. Neurologic:  Normal speech and language. No gross focal neurologic deficits are appreciated. No gait instability. Skin:  Skin is warm, dry and intact. Psychiatric: Mood and affect are normal. Speech and behavior are normal.  ____________________________________________   LABS (all labs ordered are listed, but only abnormal results are displayed)   Labs Reviewed - No data to display  RADIOLOGY  Official radiology report(s): Dg Knee Complete 4 Views Left  Result Date: 04/29/2019 CLINICAL DATA:  Acute left knee pain after injury last night. EXAM: LEFT KNEE - COMPLETE 4+ VIEW COMPARISON:  None. FINDINGS: No evidence of fracture, dislocation, or joint effusion. No evidence of arthropathy or other focal bone abnormality. Soft tissues are unremarkable. IMPRESSION: Negative. Electronically Signed   By: Marijo Conception M.D.   On: 04/29/2019 10:42    ____________________________________________   PROCEDURES  Procedure(s) performed (including Critical Care):  Procedures   ____________________________________________   INITIAL IMPRESSION / ASSESSMENT AND PLAN / ED COURSE  As part of my medical decision making, I reviewed the following data within the electronic MEDICAL RECORD NUMBER Notes from prior ED visits and [place] Controlled Substance Database  28 year old presents to the ED with complaint of dental pain and also left knee pain after he stepped in a hole last evening and falling forward.  He denies any head injury or loss of consciousness during that time.  Physical exam on the left knee has low suspicion for a fracture.  There are several dental cavities present and poor repair.  X-rays of the left knee were negative and patient was made aware.  He was given a prescription for amoxicillin 875 twice daily for his dental pain.  Ibuprofen and Norco as needed for his left knee pain.  Patient was given a list of dental clinics to follow-up with.  He is encouraged to ice and elevate his knee as needed for discomfort.  ____________________________________________   FINAL CLINICAL IMPRESSION(S) / ED DIAGNOSES  Final diagnoses:  Pain, dental  Sprain of left knee, unspecified ligament, initial encounter     ED Discharge Orders         Ordered    amoxicillin (AMOXIL) 875 MG tablet  2 times daily     04/29/19 1056     HYDROcodone-acetaminophen (NORCO/VICODIN) 5-325 MG tablet  Every 6 hours PRN     04/29/19 1056    ibuprofen (ADVIL) 600 MG tablet  Every 8 hours PRN     04/29/19 1056           Note:  This document was prepared using Dragon voice recognition software and may include unintentional dictation errors.    Johnn Hai, PA-C 04/29/19 1600    Delman Kitten, MD 04/29/19 1606

## 2019-07-28 ENCOUNTER — Other Ambulatory Visit: Payer: Self-pay

## 2019-07-28 ENCOUNTER — Encounter: Payer: Self-pay | Admitting: Emergency Medicine

## 2019-07-28 ENCOUNTER — Emergency Department
Admission: EM | Admit: 2019-07-28 | Discharge: 2019-07-28 | Disposition: A | Discharge status: Home / Self Care | Payer: Self-pay | Attending: Emergency Medicine | Admitting: Emergency Medicine

## 2019-07-28 DIAGNOSIS — F32A Depression, unspecified: Secondary | ICD-10-CM

## 2019-07-28 DIAGNOSIS — F419 Anxiety disorder, unspecified: Secondary | ICD-10-CM | POA: Insufficient documentation

## 2019-07-28 DIAGNOSIS — Z87891 Personal history of nicotine dependence: Secondary | ICD-10-CM | POA: Insufficient documentation

## 2019-07-28 DIAGNOSIS — F329 Major depressive disorder, single episode, unspecified: Secondary | ICD-10-CM

## 2019-07-28 DIAGNOSIS — F4321 Adjustment disorder with depressed mood: Secondary | ICD-10-CM | POA: Insufficient documentation

## 2019-07-28 DIAGNOSIS — F322 Major depressive disorder, single episode, severe without psychotic features: Secondary | ICD-10-CM | POA: Insufficient documentation

## 2019-07-28 LAB — URINE DRUG SCREEN, QUALITATIVE (ARMC ONLY)
Amphetamines, Ur Screen: NOT DETECTED
Barbiturates, Ur Screen: NOT DETECTED
Benzodiazepine, Ur Scrn: NOT DETECTED
Cannabinoid 50 Ng, Ur ~~LOC~~: POSITIVE — AB
Cocaine Metabolite,Ur ~~LOC~~: NOT DETECTED
MDMA (Ecstasy)Ur Screen: NOT DETECTED
Methadone Scn, Ur: NOT DETECTED
Opiate, Ur Screen: NOT DETECTED
Phencyclidine (PCP) Ur S: NOT DETECTED
Tricyclic, Ur Screen: NOT DETECTED

## 2019-07-28 LAB — CBC
HCT: 51.9 % (ref 39.0–52.0)
Hemoglobin: 16.9 g/dL (ref 13.0–17.0)
MCH: 26.2 pg (ref 26.0–34.0)
MCHC: 32.6 g/dL (ref 30.0–36.0)
MCV: 80.5 fL (ref 80.0–100.0)
Platelets: 337 10*3/uL (ref 150–400)
RBC: 6.45 MIL/uL — ABNORMAL HIGH (ref 4.22–5.81)
RDW: 13.2 % (ref 11.5–15.5)
WBC: 6.7 10*3/uL (ref 4.0–10.5)
nRBC: 0 % (ref 0.0–0.2)

## 2019-07-28 LAB — COMPREHENSIVE METABOLIC PANEL
ALT: 53 U/L — ABNORMAL HIGH (ref 0–44)
AST: 33 U/L (ref 15–41)
Albumin: 4.7 g/dL (ref 3.5–5.0)
Alkaline Phosphatase: 95 U/L (ref 38–126)
Anion gap: 13 (ref 5–15)
BUN: 12 mg/dL (ref 6–20)
CO2: 23 mmol/L (ref 22–32)
Calcium: 9.6 mg/dL (ref 8.9–10.3)
Chloride: 104 mmol/L (ref 98–111)
Creatinine, Ser: 1.07 mg/dL (ref 0.61–1.24)
GFR calc Af Amer: >60 mL/min (ref >60)
GFR calc non Af Amer: >60 mL/min (ref >60)
Glucose, Bld: 122 mg/dL — ABNORMAL HIGH (ref 70–99)
Potassium: 3.7 mmol/L (ref 3.5–5.1)
Sodium: 140 mmol/L (ref 135–145)
Total Bilirubin: 1.4 mg/dL — ABNORMAL HIGH (ref 0.3–1.2)
Total Protein: 8.8 g/dL — ABNORMAL HIGH (ref 6.5–8.1)

## 2019-07-28 NOTE — ED Notes (Signed)
Introduced self to pt. Flow and rules reviewed with pt. Pts answers questions with 1 word answers. Pt denies SI/HI. Pt looks depressed, wiping tears from eyes several times during interaction.

## 2019-07-28 NOTE — ED Triage Notes (Signed)
Pt changed, belongings bagged, tagged and handed off by Joni Reining NT to [place] NT.  2- white t-shirts 1- red sweater 1- black toboggan 1-pair of black shoes 1- pair of white socks 1 white metal necklace 1- blue underwear 1-cell phone

## 2019-07-28 NOTE — ED Triage Notes (Signed)
Pt reports a lot of things happening in his life and he feels like he is on the verge of a mental breakdown. Pt denies SI/HI. Pt tearful in triage.

## 2019-07-28 NOTE — Consult Note (Signed)
[facility] Face-to-Face Psychiatry Consult   Reason for Consult: Depression Referring Physician: Dr. Derrill Kay Patient Identification: Cory Chan MRN:  [id number] Principal Diagnosis: <principal problem not specified> Diagnosis:  Active Problems:   * No active hospital problems. *   Total Time spent with patient: 45 minutes  Subjective:   Cory Chan is a 29 y.o. male patient who presented for symptoms of depression.Marland Kitchen  HPI: Patient is a 29 year old male with no reported psych history who presents with symptoms of depression.  Patient states that last night around 5 PM he started to feel overwhelmed by his life stressors including financial and relationship issues as well as legal issues.  Patient states that these thoughts became so overwhelming that he did not want to stay at home and began to pace.  He called his sister and they felt that he should talk to someone for help.  Patient did not know where to turn so he presented to the emergency department. Patient denies that during this episode he felt suicidal or psychotic.  He denies any hallucinations paranoia or manic symptoms.  He denies any suicidal thoughts or attempts presently or in the past.  Patient states that he is really looking for someone to be able to talk to so that he does not have to feel so stressed out all the time.  Patient agreeable with outpatient resources and request to be discharged.  Patient given outpatient resources for psychiatric services in the community.  Past Psychiatric History: Patient has no previous psych history including no previous suicide attempts no previous psychiatric hospitalizations.  Risk to Self: Suicidal Ideation: (P) No Suicidal Intent: (P) No Is patient at risk for suicide?: (P) No Suicidal Plan?: (P) No Access to Means: (P) No What has been your use of drugs/alcohol within the last 12 months?: (P) Cannabis How many times?: (P) 0 Other Self Harm Risks: (P) Reports of none Triggers for  Past Attempts: (P) None known Intentional Self Injurious Behavior: (P) None Risk to Others: Homicidal Ideation: (P) No Thoughts of Harm to Others: (P) No Current Homicidal Intent: (P) No Current Homicidal Plan: (P) No Access to Homicidal Means: (P) No Identified Victim: (P) Reports of none History of harm to others?: (P) No Assessment of Violence: (P) None Noted Violent Behavior Description: (P) Reports of none Does patient have access to weapons?: (P) No Criminal Charges Pending?: (P) No Does patient have a court date: (P) No Prior Inpatient Therapy: Prior Inpatient Therapy: (P) No Prior Outpatient Therapy: Prior Outpatient Therapy: (P) No  Past Medical History: History reviewed. No pertinent past medical history. History reviewed. No pertinent surgical history. Family History: No family history on file. Family Psychiatric  History: Denies Social History:  Social History   Substance and Sexual Activity  Alcohol Use No     Social History   Substance and Sexual Activity  Drug Use Yes  . Types: Marijuana   Comment: Daily    Social History   Socioeconomic History  . Marital status: Single    Spouse name: Not on file  . Number of children: Not on file  . Years of education: Not on file  . Highest education level: Not on file  Occupational History  . Not on file  Tobacco Use  . Smoking status: Former Smoker    Packs/day: 0.25    Types: Cigarettes  . Smokeless tobacco: Never Used  Substance and Sexual Activity  . Alcohol use: No  . Drug use: Yes    Types: Marijuana  Comment: Daily  . Sexual activity: Not on file  Other Topics Concern  . Not on file  Social History Narrative  . Not on file   Social Determinants of Health   Financial Resource Strain:   . Difficulty of Paying Living Expenses: Not on file  Food Insecurity:   . Worried About Programme researcher, broadcasting/film/video in the Last Year: Not on file  . Ran Out of Food in the Last Year: Not on file  Transportation  Needs:   . Lack of Transportation (Medical): Not on file  . Lack of Transportation (Non-Medical): Not on file  Physical Activity:   . Days of Exercise per Week: Not on file  . Minutes of Exercise per Session: Not on file  Stress:   . Feeling of Stress : Not on file  Social Connections:   . Frequency of Communication with Friends and Family: Not on file  . Frequency of Social Gatherings with Friends and Family: Not on file  . Attends Religious Services: Not on file  . Active Member of Clubs or Organizations: Not on file  . Attends Banker Meetings: Not on file  . Marital Status: Not on file   Additional Social History:    Allergies:  No Known Allergies  Labs:  Results for orders placed or performed during the hospital encounter of 07/28/19 (from the past 48 hour(s))  Comprehensive metabolic panel     Status: Abnormal   Collection Time: 07/28/19  7:23 AM  Result Value Ref Range   Sodium 140 135 - 145 mmol/L   Potassium 3.7 3.5 - 5.1 mmol/L   Chloride 104 98 - 111 mmol/L   CO2 23 22 - 32 mmol/L   Glucose, Bld 122 (H) 70 - 99 mg/dL   BUN 12 6 - 20 mg/dL   Creatinine, Ser 6.43 0.61 - 1.24 mg/dL   Calcium 9.6 8.9 - 32.9 mg/dL   Total Protein 8.8 (H) 6.5 - 8.1 g/dL   Albumin 4.7 3.5 - 5.0 g/dL   AST 33 15 - 41 U/L   ALT 53 (H) 0 - 44 U/L   Alkaline Phosphatase 95 38 - 126 U/L   Total Bilirubin 1.4 (H) 0.3 - 1.2 mg/dL   GFR calc non Af Amer >60 >60 mL/min   GFR calc Af Amer >60 >60 mL/min   Anion gap 13 5 - 15    Comment: Performed at [facility], [address] [place], [place] [address]  cbc     Status: Abnormal   Collection Time: 07/28/19  7:23 AM  Result Value Ref Range   WBC 6.7 4.0 - 10.5 K/uL   RBC 6.45 (H) 4.22 - 5.81 MIL/uL   Hemoglobin 16.9 13.0 - 17.0 g/dL   HCT 16.6 06.3 - 01.6 %   MCV 80.5 80.0 - 100.0 fL   MCH 26.2 26.0 - 34.0 pg   MCHC 32.6 30.0 - 36.0 g/dL   RDW 01.0 93.2 - 35.5 %   Platelets 337 150 - 400 K/uL   nRBC 0.0 0.0 -  0.2 %    Comment: Performed at [facility], [address], [place], [place] [address]  Urine Drug Screen, Qualitative     Status: Abnormal   Collection Time: 07/28/19  7:23 AM  Result Value Ref Range   Tricyclic, Ur Screen NONE DETECTED NONE DETECTED   Amphetamines, Ur Screen NONE DETECTED NONE DETECTED   MDMA (Ecstasy)Ur Screen NONE DETECTED NONE DETECTED   Cocaine Metabolite,Ur [place]  NONE DETECTED NONE DETECTED   Opiate, Ur Screen NONE DETECTED NONE DETECTED   Phencyclidine (PCP) Ur S NONE DETECTED NONE DETECTED   Cannabinoid 50 Ng, Ur [place] POSITIVE (A) NONE DETECTED   Barbiturates, Ur Screen NONE DETECTED NONE DETECTED   Benzodiazepine, Ur Scrn NONE DETECTED NONE DETECTED   Methadone Scn, Ur NONE DETECTED NONE DETECTED    Comment: (NOTE) Tricyclics + metabolites, urine    Cutoff 1000 ng/mL Amphetamines + metabolites, urine  Cutoff 1000 ng/mL MDMA (Ecstasy), urine              Cutoff 500 ng/mL Cocaine Metabolite, urine          Cutoff 300 ng/mL Opiate + metabolites, urine        Cutoff 300 ng/mL Phencyclidine (PCP), urine         Cutoff 25 ng/mL Cannabinoid, urine                 Cutoff 50 ng/mL Barbiturates + metabolites, urine  Cutoff 200 ng/mL Benzodiazepine, urine              Cutoff 200 ng/mL Methadone, urine                   Cutoff 300 ng/mL The urine drug screen provides only a preliminary, unconfirmed analytical test result and should not be used for non-medical purposes. Clinical consideration and professional judgment should be applied to any positive drug screen result due to possible interfering substances. A more specific alternate chemical method must be used in order to obtain a confirmed analytical result. Gas chromatography / mass spectrometry (GC/MS) is the preferred confirmat ory method. Performed at [facility], [address] [place], [place] [address]     No current facility-administered medications for this encounter.   Current  Outpatient Medications  Medication Sig Dispense Refill  . amoxicillin (AMOXIL) 875 MG tablet Take 1 tablet (875 mg total) by mouth 2 (two) times daily. 20 tablet 0  . HYDROcodone-acetaminophen (NORCO/VICODIN) 5-325 MG tablet Take 1 tablet by mouth every 6 (six) hours as needed for moderate pain. 15 tablet 0  . ibuprofen (ADVIL) 600 MG tablet Take 1 tablet (600 mg total) by mouth every 8 (eight) hours as needed. 20 tablet 0    Musculoskeletal: Strength & Muscle Tone: within normal limits Gait & Station: normal Patient leans: N/A  Psychiatric Specialty Exam: Physical Exam  Constitutional: He appears well-developed and well-nourished.  HENT:  Head: Normocephalic.  Eyes: Pupils are equal, round, and reactive to light.  Musculoskeletal:     Cervical back: Normal range of motion.  Skin: Skin is warm.    Review of Systems  Blood pressure (!) 138/92, pulse 85, temperature 98 F (36.7 C), temperature source Oral, resp. rate 17, SpO2 99 %.There is no height or weight on file to calculate BMI.  General Appearance: Casual  Eye Contact:  Good  Speech:  Clear and Coherent  Volume:  Normal  Mood:  Euthymic  Affect:  Full Range  Thought Process:  Coherent  Orientation:  Full (Time, Place, and Person)  Thought Content:  Logical  Suicidal Thoughts:  No  Homicidal Thoughts:  No  Memory:  Recent;   Fair  Judgement:  Intact  Insight:  Fair  Psychomotor Activity:  Normal  Concentration:  Concentration: Good  Recall:  Good  Fund of Knowledge:  Good  Language:  Good  Akathisia:  No  Handed:  Right  AIMS (if indicated):     Assets:  Communication Skills Desire for Improvement Housing Leisure Time Vocational/Educational  ADL's:  Intact  Cognition:  WNL  Sleep:        Treatment Plan Summary: 29 year old male with no past psychiatric history presents with symptoms of depression.  Patient adamantly denied any psychotic or suicidal symptoms.  Patient agreeable to outpatient resources as  he feels safe leaving the ED and getting help in the community.  Diagnosis: Adjustment disorder depressed mood  Disposition: No evidence of imminent risk to self or others at present.   Patient does not meet criteria for psychiatric inpatient admission. Supportive therapy provided about ongoing stressors. Discussed crisis plan, support from social network, calling 911, coming to the Emergency Department, and calling Suicide Hotline.  Clement Sayres, MD 07/28/2019 2:19 PM

## 2019-07-28 NOTE — BH Assessment (Signed)
Assessment Note  Cory Chan is an 29 y.o. male who presents to the ER because he believes he was going to have a "nervous breakdown and lose my mind." Per the patient, he has been stressed due to his job and current involvement with the legal system. He missed a court date in another county and currently have a "bench warrant" for his arrest. He was charged with driving without a license. He states he didn't know about the court date and was at work during the time he was suppose to appear. Patient found out about the missed court date when he called and checked on it. He states his sleep decreased and having anxiety. This is the first time the patient experienced anything like this. Prior to his current visit, he denies any mental health concerns or problems. He admits to the use of cannabis.  During the interview, the patient was calm, cooperative and pleasant. He was able to provide appropriate answers. He denies SI/HI and AV/H.  Diagnosis: Depression  Past Medical History: History reviewed. No pertinent past medical history.  History reviewed. No pertinent surgical history.  Family History: No family history on file.  Social History:  reports that he has quit smoking. His smoking use included cigarettes. He smoked 0.25 packs per day. He has never used smokeless tobacco. He reports current drug use. Drug: Marijuana. He reports that he does not drink alcohol.  Additional Social History:  Alcohol / Drug Use Pain Medications: See PTA Prescriptions: See PTA Over the Counter: See PTA History of alcohol / drug use?: Yes Longest period of sobriety (when/how long): Unable to quantify Substance #1 Name of Substance 1: Cannabis  CIWA: CIWA-Ar BP: (!) 138/92 Pulse Rate: 85 COWS:    Allergies: No Known Allergies  Home Medications: (Not in a hospital admission)   OB/GYN Status:  No LMP for male patient.  General Assessment Data Location of Assessment: [facility] ED TTS Assessment: In  system Is this a Tele or Face-to-Face Assessment?: Face-to-Face Is this an Initial Assessment or a Re-assessment for this encounter?: Initial Assessment Patient Accompanied by:: N/A Language Other than English: No Living Arrangements: Other (Comment)(Private Home) What gender do you identify as?: Male Marital status: Long term relationship Pregnancy Status: No Can pt return to current living arrangement?: Yes Admission Status: Voluntary Is patient capable of signing voluntary admission?: Yes Referral Source: Self/Family/Friend Insurance type: None  Medical Screening Exam [facility] Walk-in ONLY) Medical Exam completed: Yes  Crisis Care Plan Legal Guardian: Other:(Self) Name of Psychiatrist: Reports of none Name of Therapist: Reports of none  Education Status Is patient currently in school?: No Is the patient employed, unemployed or receiving disability?: Employed  Risk to self with the past 6 months Suicidal Ideation: No Has patient been a risk to self within the past 6 months prior to admission? : No Suicidal Intent: No Has patient had any suicidal intent within the past 6 months prior to admission? : No Is patient at risk for suicide?: No Suicidal Plan?: No Has patient had any suicidal plan within the past 6 months prior to admission? : No Access to Means: No What has been your use of drugs/alcohol within the last 12 months?: Cannabis Previous Attempts/Gestures: No How many times?: 0 Other Self Harm Risks: Reports of none Triggers for Past Attempts: None known Intentional Self Injurious Behavior: None Family Suicide History: Unknown("Not that I know of.") Recent stressful life event(s): Financial Problems, Legal Issues Persecutory voices/beliefs?: No Depression: Yes Depression Symptoms: Isolating, Loss of  interest in usual pleasures, Feeling worthless/self pity Substance abuse history and/or treatment for substance abuse?: Yes Suicide prevention information given to  non-admitted patients: Not applicable  Risk to Others within the past 6 months Homicidal Ideation: No Does patient have any lifetime risk of violence toward others beyond the six months prior to admission? : No Thoughts of Harm to Others: No Current Homicidal Intent: No Current Homicidal Plan: No Access to Homicidal Means: No Identified Victim: Reports of none History of harm to others?: No Assessment of Violence: None Noted Violent Behavior Description: Reports of none Does patient have access to weapons?: No Criminal Charges Pending?: No Does patient have a court date: No Is patient on probation?: No  Psychosis Hallucinations: None noted Delusions: None noted  Mental Status Report Appearance/Hygiene: Unremarkable, In scrubs Eye Contact: Good Motor Activity: Freedom of movement, Unremarkable Speech: Logical/coherent, Unremarkable Level of Consciousness: Alert Mood: Anxious, Pleasant, Sad Affect: Anxious, Sad Anxiety Level: Minimal Thought Processes: Coherent, Relevant Judgement: Unimpaired Orientation: Person, Place, Time, Situation, Appropriate for developmental age Obsessive Compulsive Thoughts/Behaviors: None  Cognitive Functioning Concentration: Normal Memory: Recent Intact, Remote Intact Is patient IDD: No Insight: Fair Impulse Control: Fair Appetite: Good Have you had any weight changes? : No Change Sleep: Decreased Total Hours of Sleep: 5(Trouble falling asleep) Vegetative Symptoms: None  ADLScreening [facility] Assessment Services) Patient's cognitive ability adequate to safely complete daily activities?: Yes Patient able to express need for assistance with ADLs?: Yes Independently performs ADLs?: Yes (appropriate for developmental age)  Prior Inpatient Therapy Prior Inpatient Therapy: No  Prior Outpatient Therapy Prior Outpatient Therapy: No Does patient have an ACCT team?: No Does patient have Intensive In-House Services?  : No Does patient have  [facility] services? : No Does patient have P4CC services?: No  ADL Screening (condition at time of admission) Patient's cognitive ability adequate to safely complete daily activities?: Yes Is the patient deaf or have difficulty hearing?: No Does the patient have difficulty seeing, even when wearing glasses/contacts?: No Does the patient have difficulty concentrating, remembering, or making decisions?: No Patient able to express need for assistance with ADLs?: Yes Does the patient have difficulty dressing or bathing?: No Independently performs ADLs?: Yes (appropriate for developmental age) Does the patient have difficulty walking or climbing stairs?: No Weakness of Legs: None Weakness of Arms/Hands: None  Home Assistive Devices/Equipment Home Assistive Devices/Equipment: None  Therapy Consults (therapy consults require a physician order) PT Evaluation Needed: No OT Evalulation Needed: No SLP Evaluation Needed: No Abuse/Neglect Assessment (Assessment to be complete while patient is alone) Abuse/Neglect Assessment Can Be Completed: Yes Physical Abuse: Denies Verbal Abuse: Denies Sexual Abuse: Denies Exploitation of patient/patient's resources: Denies Self-Neglect: Denies   Consults Spiritual Care Consult Needed: No Transition of Care Team Consult Needed: No   Nutrition Screen- [facility] Adult/WL/AP Patient's home diet: Regular    Child/Adolescent Assessment Running Away Risk: Denies(Patient is an adult)  Disposition:  Disposition Initial Assessment Completed for this Encounter: Yes  On Site Evaluation by:   Reviewed with Physician:    Gunnar Fusi MS, LCAS, [facility], [place] Therapeutic Triage Specialist 07/28/2019 2:46 PM

## 2019-07-28 NOTE — Discharge Instructions (Addendum)
Please seek medical attention and help for any thoughts about wanting to harm yourself, harm others, any concerning change in behavior, severe depression, inappropriate drug use or any other new or concerning symptoms. ° °

## 2019-07-28 NOTE — BH Assessment (Signed)
Per request of ER Psych MD (Dr. Cindi Carbon), writer provided the patient with information and instructions on how to access Outpatient Mental Health Treatment ([facility] and [facility]).   Patient denies SI/HI and AV/H.  ______________________ [facility] [address]  [place], [place] [address] [phone]  [facility] [address],  [place], [place] [address] [phone]

## 2019-07-28 NOTE — ED Provider Notes (Signed)
[facility] Emergency Department Provider Note    ____________________________________________   I have reviewed the triage vital signs and the nursing notes.   HISTORY  Chief Complaint Anxiety and Depression   History limited by: Not Limited   HPI Cory Chan is a 29 y.o. male who presents to the emergency department today because of concerns for mental breakdown.  The patient states he woke up in bed this morning and felt like he was going to have a mental breakdown.  He states that he has been dealing with stress for most of his life.  He says that he has never had anybody to talk to about it.  Endorses lots of stress in his life but does not state any particular episode happened recently. The patient denies any medical complaints, denies being on any medication.   Records reviewed. Per medical record review patient has a history of poor dentition.  History reviewed. No pertinent past medical history.  There are no problems to display for this patient.   History reviewed. No pertinent surgical history.  Prior to Admission medications   Medication Sig Start Date End Date Taking? Authorizing Provider  amoxicillin (AMOXIL) 875 MG tablet Take 1 tablet (875 mg total) by mouth 2 (two) times daily. 04/29/19   Johnn Hai, PA-C  HYDROcodone-acetaminophen (NORCO/VICODIN) 5-325 MG tablet Take 1 tablet by mouth every 6 (six) hours as needed for moderate pain. 04/29/19   Johnn Hai, PA-C  ibuprofen (ADVIL) 600 MG tablet Take 1 tablet (600 mg total) by mouth every 8 (eight) hours as needed. 04/29/19   Johnn Hai, PA-C    Allergies Patient has no known allergies.  No family history on file.  Social History Social History   Tobacco Use  . Smoking status: Former Smoker    Packs/day: 0.25    Types: Cigarettes  . Smokeless tobacco: Never Used  Substance Use Topics  . Alcohol use: No  . Drug use: Yes    Types: Marijuana    Comment:  Daily    Review of Systems Constitutional: No fever/chills Eyes: No visual changes. ENT: No sore throat. Cardiovascular: Denies chest pain. Respiratory: Denies shortness of breath. Gastrointestinal: No abdominal pain.  No nausea, no vomiting.  No diarrhea.   Genitourinary: Negative for dysuria. Musculoskeletal: Negative for back pain. Skin: Negative for rash. Neurological: Negative for headaches, focal weakness or numbness.  ____________________________________________   PHYSICAL EXAM:  VITAL SIGNS: ED Triage Vitals  Enc Vitals Group     BP 07/28/19 0713 134/86     Pulse Rate 07/28/19 0713 94     Resp 07/28/19 0713 20     Temp 07/28/19 0713 98 F (36.7 C)     Temp Source 07/28/19 0713 Oral     SpO2 07/28/19 0713 98 %     Weight --      Height --      Head Circumference --      Peak Flow --      Pain Score 07/28/19 0716 0    Constitutional: Alert and oriented.  Eyes: Conjunctivae are normal.  ENT      Head: Normocephalic and atraumatic.      Nose: No congestion/rhinnorhea.      Mouth/Throat: Mucous membranes are moist.      Neck: No stridor. Hematological/Lymphatic/Immunilogical: No cervical lymphadenopathy. Cardiovascular: Normal rate, regular rhythm.  No murmurs, rubs, or gallops.  Respiratory: Normal respiratory effort without tachypnea nor retractions. Breath sounds are clear and equal bilaterally.  No wheezes/rales/rhonchi. Gastrointestinal: Soft and non tender. No rebound. No guarding.  Genitourinary: Deferred Musculoskeletal: Normal range of motion in all extremities.  Neurologic:  Normal speech and language. No gross focal neurologic deficits are appreciated.  Skin:  Skin is warm, dry and intact. No rash noted. Psychiatric: Depressed. Tearful.   ____________________________________________    LABS (pertinent positives/negatives)  CBC wbc 6.7, hgb 16.9, plt 337 CMP wnl except glu 122, t pro 8.8, alt 53, t bili 1.4 UDS positive  cannabinoid ____________________________________________   EKG  None  ____________________________________________    RADIOLOGY  None  ____________________________________________   PROCEDURES  Procedures  ____________________________________________   INITIAL IMPRESSION / ASSESSMENT AND PLAN / ED COURSE  Pertinent labs & imaging results that were available during my care of the patient were reviewed by me and considered in my medical decision making (see chart for details).   Patient presented to the emergency department today because of concerns for depression and stress.  On exam patient is tearful.  Will not relate to me a specific episode that triggered it to be worse today.  Psychiatry did evaluate the patient.  At this time they do feel it is safe for patient to be discharged home.  Patient was given outpatient resources.    ____________________________________________   FINAL CLINICAL IMPRESSION(S) / ED DIAGNOSES  Final diagnoses:  Anxiety  Depression, unspecified depression type     Note: This dictation was prepared with Dragon dictation. Any transcriptional errors that result from this process are unintentional     Phineas Semen, MD 07/28/19 1132

## 2019-08-04 ENCOUNTER — Emergency Department
Admission: EM | Admit: 2019-08-04 | Discharge: 2019-08-04 | Disposition: A | Discharge status: Home / Self Care | Payer: Self-pay | Attending: Emergency Medicine | Admitting: Emergency Medicine

## 2019-08-04 ENCOUNTER — Emergency Department: Payer: Self-pay

## 2019-08-04 ENCOUNTER — Encounter: Payer: Self-pay | Admitting: Emergency Medicine

## 2019-08-04 ENCOUNTER — Other Ambulatory Visit: Payer: Self-pay

## 2019-08-04 DIAGNOSIS — M25562 Pain in left knee: Secondary | ICD-10-CM | POA: Insufficient documentation

## 2019-08-04 DIAGNOSIS — Z87891 Personal history of nicotine dependence: Secondary | ICD-10-CM | POA: Insufficient documentation

## 2019-08-04 MED ORDER — NAPROXEN 500 MG PO TABS: 500.0000 mg | ORAL_TABLET | Freq: Two times a day (BID) | ORAL | 0 refills | Status: DC

## 2019-08-04 NOTE — Discharge Instructions (Signed)
Call make an appoint with Dr. Joice Lofts who is the orthopedist on call for can no clinic today.  You may use ice to your knee as needed for discomfort.  Begin taking naproxen 500 mg twice daily with food.  Your x-rays today were negative for any acute bony injury.

## 2019-08-04 NOTE — ED Provider Notes (Signed)
[facility] Emergency Department Provider Note  ____________________________________________   First MD Initiated Contact with Patient 08/04/19 1359     (approximate)  I have reviewed the triage vital signs and the nursing notes.   HISTORY  Chief Complaint Knee Pain   HPI Mitchael Luckey is a 29 y.o. male presents to the ED with complaint of knee pain for 2 months but states that is worse today.  Patient states that he was unable to stand for extended periods of time at work.  He has not been taking any over-the-counter medication for his pain.  He denies any recent injury.  He states that he was seen in the ED 2 months ago but was not referred to an orthopedist.  Currently rates his pain as a 0/10.     History reviewed. No pertinent past medical history.  Patient Active Problem List   Diagnosis Date Noted  . Adjustment disorder with depressed mood     History reviewed. No pertinent surgical history.  Prior to Admission medications   Medication Sig Start Date End Date Taking? Authorizing Provider  naproxen (NAPROSYN) 500 MG tablet Take 1 tablet (500 mg total) by mouth 2 (two) times daily with a meal. 08/04/19   Tommi Rumps, PA-C    Allergies Patient has no known allergies.  No family history on file.  Social History Social History   Tobacco Use  . Smoking status: Former Smoker    Packs/day: 0.25    Types: Cigarettes  . Smokeless tobacco: Never Used  Substance Use Topics  . Alcohol use: No  . Drug use: Yes    Types: Marijuana    Comment: Daily    Review of Systems Constitutional: No fever/chills Cardiovascular: Denies chest pain. Respiratory: Denies shortness of breath. Musculoskeletal: Positive for left knee pain. Skin: Negative for rash. Neurological: Negative for headaches, focal weakness or numbness. ____________________________________________   PHYSICAL EXAM:  VITAL SIGNS: ED Triage Vitals  Enc Vitals Group     BP  08/04/19 1334 135/81     Pulse Rate 08/04/19 1334 66     Resp 08/04/19 1334 16     Temp 08/04/19 1334 98.1 F (36.7 C)     Temp Source 08/04/19 1334 Oral     SpO2 08/04/19 1334 98 %     Weight 08/04/19 1337 200 lb (90.7 kg)     Height 08/04/19 1337 5\' 9"  (1.753 m)     Head Circumference --      Peak Flow --      Pain Score 08/04/19 1337 0     Pain Loc --      Pain Edu? --      Excl. in GC? --     Constitutional: Alert and oriented. Well appearing and in no acute distress. Eyes: Conjunctivae are normal.  Head: Atraumatic. Neck: No stridor.   Cardiovascular: Normal rate, regular rhythm. Grossly normal heart sounds.  Good peripheral circulation. Respiratory: Normal respiratory effort.  No retractions. Lungs CTAB. Musculoskeletal: Examination of the left knee in comparison with the right there is no gross deformity.  There is no effusion present.  No crepitus with range of motion.  Ligaments are stable bilaterally.  No warmth or redness is noted.  Patient is ambulatory without a limp. Neurologic:  Normal speech and language. No gross focal neurologic deficits are appreciated. No gait instability. Skin:  Skin is warm, dry and intact. No rash noted. Psychiatric: Mood and affect are normal. Speech and behavior are normal.  ____________________________________________   LABS (all labs ordered are listed, but only abnormal results are displayed)  Labs Reviewed - No data to display  RADIOLOGY   Official radiology report(s): DG Knee Complete 4 Views Left  Result Date: 08/04/2019 CLINICAL DATA:  Posterior knee pain, injury 2 months ago EXAM: LEFT KNEE - COMPLETE 4+ VIEW COMPARISON:  04/29/2019 FINDINGS: No evidence of fracture, dislocation, or joint effusion. No evidence of arthropathy or other focal bone abnormality. Soft tissues are unremarkable. IMPRESSION: No fracture or dislocation of the left knee. Joint spaces are well preserved. No radiographic findings to explain pain.  Electronically Signed   By: Eddie Candle M.D.   On: 08/04/2019 14:32    ____________________________________________   PROCEDURES  Procedure(s) performed (including Critical Care):  Procedures   ____________________________________________   INITIAL IMPRESSION / ASSESSMENT AND PLAN / ED COURSE  As part of my medical decision making, I reviewed the following data within the electronic MEDICAL RECORD NUMBER Notes from prior ED visits and [place] Controlled Substance Database  29 year old male presents to the ED with complaint of left knee pain for 2 months.  Patient was seen in October 2020 instead of 2 months ago.  Patient was to follow-up with orthopedics but did not.  There is been no recent injury and x-rays remain the same.  Patient was made aware that there was no bony injury.  He was given a prescription for naproxen 500 mg twice daily with food.  He is to call make an appoint with Dr. Roland Rack who is the orthopedist on call.   Patient requested a note as he did not go to work today.  ____________________________________________   FINAL CLINICAL IMPRESSION(S) / ED DIAGNOSES  Final diagnoses:  Left knee pain, unspecified chronicity     ED Discharge Orders         Ordered    naproxen (NAPROSYN) 500 MG tablet  2 times daily with meals     08/04/19 1500           Note:  This document was prepared using Dragon voice recognition software and may include unintentional dictation errors.    Johnn Hai, PA-C 08/04/19 1739    Lavonia Drafts, MD 08/09/19 [phone]

## 2019-08-04 NOTE — ED Triage Notes (Signed)
Patient presents to the ED with left knee pain x 2 months but worse today.  Patient states today at work he was unable to stand for extended periods of time.  Patient states pain is intermittent and not associated with known injury.

## 2020-03-15 ENCOUNTER — Encounter: Payer: Self-pay | Admitting: *Deleted

## 2020-03-15 ENCOUNTER — Emergency Department
Admission: EM | Admit: 2020-03-15 | Discharge: 2020-03-15 | Disposition: A | Discharge status: Home / Self Care | Payer: Self-pay | Attending: Emergency Medicine | Admitting: Emergency Medicine

## 2020-03-15 ENCOUNTER — Other Ambulatory Visit: Payer: Self-pay

## 2020-03-15 DIAGNOSIS — Z5321 Procedure and treatment not carried out due to patient leaving prior to being seen by health care provider: Secondary | ICD-10-CM | POA: Insufficient documentation

## 2020-03-15 DIAGNOSIS — M791 Myalgia, unspecified site: Secondary | ICD-10-CM | POA: Insufficient documentation

## 2020-03-15 DIAGNOSIS — R519 Headache, unspecified: Secondary | ICD-10-CM | POA: Insufficient documentation

## 2020-03-15 DIAGNOSIS — R05 Cough: Secondary | ICD-10-CM | POA: Insufficient documentation

## 2020-03-15 DIAGNOSIS — R509 Fever, unspecified: Secondary | ICD-10-CM | POA: Insufficient documentation

## 2020-03-15 LAB — CBC WITH DIFFERENTIAL/PLATELET
Abs Immature Granulocytes: 0.03 10*3/uL (ref 0.00–0.07)
Basophils Absolute: 0 10*3/uL (ref 0.0–0.1)
Basophils Relative: 0 %
Eosinophils Absolute: 0 10*3/uL (ref 0.0–0.5)
Eosinophils Relative: 0 %
HCT: 46.3 % (ref 39.0–52.0)
Hemoglobin: 15 g/dL (ref 13.0–17.0)
Immature Granulocytes: 0 %
Lymphocytes Relative: 8 %
Lymphs Abs: 0.7 10*3/uL (ref 0.7–4.0)
MCH: 26.3 pg (ref 26.0–34.0)
MCHC: 32.4 g/dL (ref 30.0–36.0)
MCV: 81.1 fL (ref 80.0–100.0)
Monocytes Absolute: 1 10*3/uL (ref 0.1–1.0)
Monocytes Relative: 12 %
Neutro Abs: 6.8 10*3/uL (ref 1.7–7.7)
Neutrophils Relative %: 80 %
Platelets: 294 10*3/uL (ref 150–400)
RBC: 5.71 MIL/uL (ref 4.22–5.81)
RDW: 13.9 % (ref 11.5–15.5)
WBC: 8.5 10*3/uL (ref 4.0–10.5)
nRBC: 0 % (ref 0.0–0.2)

## 2020-03-15 LAB — BASIC METABOLIC PANEL
Anion gap: 11 (ref 5–15)
BUN: 12 mg/dL (ref 6–20)
CO2: 21 mmol/L — ABNORMAL LOW (ref 22–32)
Calcium: 8.9 mg/dL (ref 8.9–10.3)
Chloride: 102 mmol/L (ref 98–111)
Creatinine, Ser: 0.95 mg/dL (ref 0.61–1.24)
GFR calc Af Amer: >60 mL/min (ref >60)
GFR calc non Af Amer: >60 mL/min (ref >60)
Glucose, Bld: 124 mg/dL — ABNORMAL HIGH (ref 70–99)
Potassium: 3.7 mmol/L (ref 3.5–5.1)
Sodium: 134 mmol/L — ABNORMAL LOW (ref 135–145)

## 2020-03-15 NOTE — ED Triage Notes (Signed)
Pt ambulatory to triage.  Pt has a cough ,intermittent fevers, headache and bodyaches for 2 days.  No n/v/d.    Pt alert  Speech clear.

## 2020-04-29 ENCOUNTER — Other Ambulatory Visit: Payer: Self-pay

## 2020-04-29 ENCOUNTER — Emergency Department
Admission: EM | Admit: 2020-04-29 | Discharge: 2020-04-29 | Disposition: A | Discharge status: Home / Self Care | Payer: Self-pay | Attending: Emergency Medicine | Admitting: Emergency Medicine

## 2020-04-29 ENCOUNTER — Encounter: Payer: Self-pay | Admitting: Emergency Medicine

## 2020-04-29 DIAGNOSIS — Z202 Contact with and (suspected) exposure to infections with a predominantly sexual mode of transmission: Secondary | ICD-10-CM

## 2020-04-29 DIAGNOSIS — Z113 Encounter for screening for infections with a predominantly sexual mode of transmission: Secondary | ICD-10-CM | POA: Insufficient documentation

## 2020-04-29 MED ORDER — METRONIDAZOLE 500 MG PO TABS
2000.0000 mg | ORAL_TABLET | Freq: Once | ORAL | Status: AC
  Administered 2020-04-29: 2000 mg via ORAL
  Filled 2020-04-29: qty 4

## 2020-04-29 MED ORDER — METRONIDAZOLE 500 MG PO TABS: 500.0000 mg | ORAL_TABLET | Freq: Two times a day (BID) | ORAL | 0 refills | Status: AC

## 2020-04-29 NOTE — ED Provider Notes (Signed)
[facility] Emergency Department Provider Note  ____________________________________________  Time seen: Approximately 1:41 PM  I have reviewed the triage vital signs and the nursing notes.   HISTORY  Chief Complaint SEXUALLY TRANSMITTED DISEASE    HPI Cory Chan is a 29 y.o. male that presents to the emergency department for trichomoniasis exposure.  Patient's partner notified him yesterday that she tested positive for trichomoniasis and recommended that he come to the emergency department to be treated as well.  Patient denies any symptoms.   History reviewed. No pertinent past medical history.  Patient Active Problem List   Diagnosis Date Noted  . Adjustment disorder with depressed mood     History reviewed. No pertinent surgical history.  Prior to Admission medications   Medication Sig Start Date End Date Taking? Authorizing Provider  metroNIDAZOLE (FLAGYL) 500 MG tablet Take 1 tablet (500 mg total) by mouth 2 (two) times daily for 7 days. 04/29/20 05/06/20  Enid Derry, PA-C  naproxen (NAPROSYN) 500 MG tablet Take 1 tablet (500 mg total) by mouth 2 (two) times daily with a meal. 08/04/19   Tommi Rumps, PA-C    Allergies Patient has no known allergies.  No family history on file.  Social History Social History   Tobacco Use  . Smoking status: Former Smoker    Packs/day: 0.25    Types: Cigarettes  . Smokeless tobacco: Never Used  Substance Use Topics  . Alcohol use: No  . Drug use: Yes    Types: Marijuana    Comment: Daily     Review of Systems  Constitutional: No fever/chills Cardiovascular: No chest pain. Respiratory: No SOB. Gastrointestinal: No abdominal pain.  No nausea, no vomiting.  Genitourinary: Negative for dysuria.  Negative for penile discharge. Musculoskeletal: Negative for musculoskeletal pain. Skin: Negative for rash, abrasions, lacerations,  ecchymosis.   ____________________________________________   PHYSICAL EXAM:  VITAL SIGNS: ED Triage Vitals  Enc Vitals Group     BP 04/29/20 1225 113/63     Pulse Rate 04/29/20 1225 72     Resp 04/29/20 1225 16     Temp 04/29/20 1225 98.3 F (36.8 C)     Temp Source 04/29/20 1225 Oral     SpO2 04/29/20 1225 99 %     Weight 04/29/20 1157 200 lb (90.7 kg)     Height 04/29/20 1157 5\' 9"  (1.753 m)     Head Circumference --      Peak Flow --      Pain Score 04/29/20 1157 0     Pain Loc --      Pain Edu? --      Excl. in GC? --      Constitutional: Alert and oriented. Well appearing and in no acute distress. Eyes: Conjunctivae are normal. PERRL. EOMI. Head: Atraumatic. ENT:      Ears:      Nose: No congestion/rhinnorhea.      Mouth/Throat: Mucous membranes are moist.  Neck: No stridor.  Cardiovascular: Normal rate, regular rhythm.  Good peripheral circulation. Respiratory: Normal respiratory effort without tachypnea or retractions. Lungs CTAB. Good air entry to the bases with no decreased or absent breath sounds. Musculoskeletal: Full range of motion to all extremities. No gross deformities appreciated. Neurologic:  Normal speech and language. No gross focal neurologic deficits are appreciated.  Skin:  Skin is warm, dry and intact. No rash noted. Psychiatric: Mood and affect are normal. Speech and behavior are normal. Patient exhibits appropriate insight and judgement.   ____________________________________________  LABS (all labs ordered are listed, but only abnormal results are displayed)  Labs Reviewed - No data to display ____________________________________________  EKG   ____________________________________________  RADIOLOGY  No results found.  ____________________________________________    PROCEDURES  Procedure(s) performed:    Procedures    Medications  metroNIDAZOLE (FLAGYL) tablet 2,000 mg (has no administration in time range)      ____________________________________________   INITIAL IMPRESSION / ASSESSMENT AND PLAN / ED COURSE  Pertinent labs & imaging results that were available during my care of the patient were reviewed by me and considered in my medical decision making (see chart for details).  Review of the [place] CSRS was performed in accordance of the [facility] prior to dispensing any controlled drugs.     Patient presented to the emergency department for trichomonas exposure.  Vital signs and exam are reassuring.  Patient was treated with Flagyl.  Education about STDs was provided.  Patient will be discharged home with prescriptions for Flagyl. Patient is to follow up with health department as directed. Patient is given ED precautions to return to the ED for any worsening or new symptoms.   Cory Chan was evaluated in Emergency Department on 04/29/2020 for the symptoms described in the history of present illness. He was evaluated in the context of the global COVID-19 pandemic, which necessitated consideration that the patient might be at risk for infection with the SARS-CoV-2 virus that causes COVID-19. Institutional protocols and algorithms that pertain to the evaluation of patients at risk for COVID-19 are in a state of rapid change based on information released by regulatory bodies including the CDC and federal and state organizations. These policies and algorithms were followed during the patient's care in the ED.  ____________________________________________  FINAL CLINICAL IMPRESSION(S) / ED DIAGNOSES  Final diagnoses:  Exposure to trichomonas      NEW MEDICATIONS STARTED DURING THIS VISIT:  ED Discharge Orders         Ordered    metroNIDAZOLE (FLAGYL) 500 MG tablet  2 times daily        04/29/20 1339              This chart was dictated using voice recognition software/Dragon. Despite best efforts to proofread, errors can occur which can change the meaning. Any change was purely  unintentional.    Enid Derry, PA-C 04/29/20 1345    Minna Antis, MD 04/29/20 [phone]

## 2020-04-29 NOTE — ED Triage Notes (Signed)
Pt reports other half was dx'd with STD yesterday and MD advised him to come in for treatment.

## 2020-12-02 ENCOUNTER — Other Ambulatory Visit: Payer: Self-pay

## 2020-12-02 ENCOUNTER — Emergency Department
Admission: EM | Admit: 2020-12-02 | Discharge: 2020-12-03 | Disposition: A | Discharge status: Home / Self Care | Payer: Self-pay | Attending: Emergency Medicine | Admitting: Emergency Medicine

## 2020-12-02 DIAGNOSIS — Z87891 Personal history of nicotine dependence: Secondary | ICD-10-CM | POA: Insufficient documentation

## 2020-12-02 DIAGNOSIS — Z202 Contact with and (suspected) exposure to infections with a predominantly sexual mode of transmission: Secondary | ICD-10-CM | POA: Insufficient documentation

## 2020-12-02 LAB — CHLAMYDIA/NGC RT PCR (ARMC ONLY)
Chlamydia Tr: NOT DETECTED
N gonorrhoeae: NOT DETECTED

## 2020-12-02 NOTE — ED Triage Notes (Signed)
Pt is here with s/o for a STD check. Pt reports that he is not having any symptoms but his girlfriend is claiming she is having symptoms so he would like to be checked for an STD. They have had one in the past that he is unsure of the name but they put them both on antibiotics.

## 2020-12-03 MED ORDER — METRONIDAZOLE 500 MG PO TABS
2000.0000 mg | ORAL_TABLET | Freq: Once | ORAL | Status: AC
  Administered 2020-12-03: 2000 mg via ORAL
  Filled 2020-12-03: qty 4

## 2020-12-03 MED ORDER — ONDANSETRON 4 MG PO TBDP
4.0000 mg | ORAL_TABLET | Freq: Once | ORAL | Status: AC
  Administered 2020-12-03: 4 mg via ORAL
  Filled 2020-12-03: qty 1

## 2020-12-03 NOTE — Discharge Instructions (Signed)
Return to the ER for worsening symptoms, persistent vomiting, difficulty breathing, penile discharge or other concerns.

## 2020-12-03 NOTE — ED Provider Notes (Signed)
[facility] Emergency Department Provider Note   ____________________________________________   Event Date/Time   First MD Initiated Contact with Patient 12/03/20 0004     (approximate)  I have reviewed the triage vital signs and the nursing notes.   HISTORY  Chief Complaint SEXUALLY TRANSMITTED DISEASE    HPI Cory Chan is a 30 y.o. male who presents to the ED with his girlfriend for STD check.  Patient states he was being treated for trichomonas and missed some doses of his antibiotic.  Denies any symptoms fever, abdominal/pelvic pain, urethral discharge, dysuria, testicular pain or swelling.     Past medical history None  Patient Active Problem List   Diagnosis Date Noted  . Adjustment disorder with depressed mood     No past surgical history on file.  Prior to Admission medications   Medication Sig Start Date End Date Taking? Authorizing Provider  naproxen (NAPROSYN) 500 MG tablet Take 1 tablet (500 mg total) by mouth 2 (two) times daily with a meal. 08/04/19   Tommi Rumps, PA-C    Allergies Patient has no known allergies.  No family history on file.  Social History Social History   Tobacco Use  . Smoking status: Former Smoker    Packs/day: 0.25    Types: Cigarettes  . Smokeless tobacco: Never Used  Substance Use Topics  . Alcohol use: No  . Drug use: Yes    Types: Marijuana    Comment: Daily    Review of Systems  Constitutional: No fever/chills Eyes: No visual changes. ENT: No sore throat. Cardiovascular: Denies chest pain. Respiratory: Denies shortness of breath. Gastrointestinal: No abdominal pain.  No nausea, no vomiting.  No diarrhea.  No constipation. Genitourinary: Negative for dysuria. Musculoskeletal: Negative for back pain. Skin: Negative for rash. Neurological: Negative for headaches, focal weakness or numbness.   ____________________________________________   PHYSICAL EXAM:  VITAL  SIGNS: ED Triage Vitals  Enc Vitals Group     BP 12/02/20 2120 117/80     Pulse Rate 12/02/20 2120 87     Resp 12/02/20 2120 20     Temp 12/02/20 2120 97.9 F (36.6 C)     Temp Source 12/02/20 2120 Oral     SpO2 12/02/20 2120 96 %     Weight 12/02/20 2123 199 lb 15.3 oz (90.7 kg)     Height 12/02/20 2123 5\' 9"  (1.753 m)     Head Circumference --      Peak Flow --      Pain Score 12/02/20 2123 0     Pain Loc --      Pain Edu? --      Excl. in GC? --     Constitutional: Alert and oriented. Well appearing and in no acute distress. Eyes: Conjunctivae are normal. PERRL. EOMI. Head: Atraumatic. Nose: No congestion/rhinnorhea. Mouth/Throat: Mucous membranes are moist.   Neck: No stridor.   Cardiovascular: Normal rate, regular rhythm. Grossly normal heart sounds.  Good peripheral circulation. Respiratory: Normal respiratory effort.  No retractions. Lungs CTAB. Gastrointestinal: Soft and nontender to light or deep palpation. No distention. No abdominal bruits. No CVA tenderness. Musculoskeletal: No lower extremity tenderness nor edema.  No joint effusions. Neurologic:  Normal speech and language. No gross focal neurologic deficits are appreciated. No gait instability. Skin:  Skin is warm, dry and intact. No rash noted. Psychiatric: Mood and affect are normal. Speech and behavior are normal.  ____________________________________________   LABS (all labs ordered are listed, but only abnormal  results are displayed)  Labs Reviewed  CHLAMYDIA/NGC RT PCR ([facility] ONLY)   ____________________________________________  EKG  None ____________________________________________  RADIOLOGY I, Amberrose Friebel J, personally viewed and evaluated these images (plain radiographs) as part of my medical decision making, as well as reviewing the written report by the radiologist.  ED MD interpretation: None  Official radiology report(s): No results  found.  ____________________________________________   PROCEDURES  Procedure(s) performed (including Critical Care):  Procedures   ____________________________________________   INITIAL IMPRESSION / ASSESSMENT AND PLAN / ED COURSE  As part of my medical decision making, I reviewed the following data within the electronic MEDICAL RECORD NUMBER Nursing notes reviewed and incorporated, Labs reviewed, Old chart reviewed and Notes from prior ED visits     30 year old male presenting for STD check having missed several doses of antibiotic for trichomonas.  Angry at long wait so genital exam deferred secondary to patient's anger and lack of symptoms.  Chlamydia/gonorrhea negative.  Will treat with Flagyl.  Strict return precautions given.  Patient verbalizes understanding agrees with plan of care      ____________________________________________   FINAL CLINICAL IMPRESSION(S) / ED DIAGNOSES  Final diagnoses:  STD exposure     ED Discharge Orders    None       Note:  This document was prepared using Dragon voice recognition software and may include unintentional dictation errors.   Irean Hong, MD 12/03/20 [phone]

## 2021-01-04 ENCOUNTER — Encounter: Payer: Self-pay | Admitting: Emergency Medicine

## 2021-01-04 ENCOUNTER — Emergency Department
Admission: EM | Admit: 2021-01-04 | Discharge: 2021-01-04 | Disposition: A | Discharge status: Home / Self Care | Payer: Self-pay | Attending: Emergency Medicine | Admitting: Emergency Medicine

## 2021-01-04 ENCOUNTER — Other Ambulatory Visit: Payer: Self-pay

## 2021-01-04 ENCOUNTER — Emergency Department: Payer: Self-pay

## 2021-01-04 DIAGNOSIS — Z87891 Personal history of nicotine dependence: Secondary | ICD-10-CM | POA: Insufficient documentation

## 2021-01-04 DIAGNOSIS — U071 COVID-19: Secondary | ICD-10-CM | POA: Insufficient documentation

## 2021-01-04 DIAGNOSIS — Z28310 Unvaccinated for covid-19: Secondary | ICD-10-CM | POA: Insufficient documentation

## 2021-01-04 LAB — RESP PANEL BY RT-PCR (FLU A&B, COVID) ARPGX2
Influenza A by PCR: NEGATIVE
Influenza B by PCR: NEGATIVE
SARS Coronavirus 2 by RT PCR: POSITIVE — AB

## 2021-01-04 MED ORDER — IBUPROFEN 800 MG PO TABS
800.0000 mg | ORAL_TABLET | Freq: Once | ORAL | Status: AC
  Administered 2021-01-04: 800 mg via ORAL
  Filled 2021-01-04: qty 1

## 2021-01-04 MED ORDER — NIRMATRELVIR/RITONAVIR (PAXLOVID)TABLET: 3.0000 | ORAL_TABLET | Freq: Two times a day (BID) | ORAL | 0 refills | Status: AC

## 2021-01-04 NOTE — ED Notes (Signed)
Report received from Mimi RN. Patient care assumed. Patient/RN introduction complete. Will continue to monitor.  

## 2021-01-04 NOTE — Discharge Instructions (Addendum)
You may alternate Tylenol 1000 mg every 6 hours as needed for pain, fever and Ibuprofen 800 mg every 8 hours as needed for pain, fever.  Please take Ibuprofen with food.  Do not take more than 4000 mg of Tylenol (acetaminophen) in a 24 hour period.  You will need to quarantine for at least 5 days after the onset of symptoms.  You will need to be fever free with symptoms improving for at least 24 hours before coming out of quarantine.  If you still continue to feel poorly or have fever at day 5, please quarantine for a full 10 days.  Steps to find a Primary Care Provider (PCP):  Call [phone] or [phone] to access "[place] Find a Doctor Service."  2.  You may also go on the [facility] website at [email or web address]

## 2021-01-04 NOTE — ED Notes (Signed)
Pt with no distress noted at this time, awaiting results.

## 2021-01-04 NOTE — ED Notes (Signed)
Patient discharged to home per MD order. Patient in stable condition, and deemed medically cleared by ED provider for discharge. Discharge instructions reviewed with patient/family using "Teach Back"; verbalized understanding of medication education and administration, and information about follow-up care. Denies further concerns. ° °

## 2021-01-04 NOTE — ED Triage Notes (Signed)
Patient ambulatory to triage with steady gait, without difficulty or distress noted; pt reports frontal HA and body aches x 2 days

## 2021-01-04 NOTE — ED Provider Notes (Signed)
[facility] Emergency Department Provider Note  ____________________________________________   Event Date/Time   First MD Initiated Contact with Patient 01/04/21 [phone]     (approximate)  I have reviewed the triage vital signs and the nursing notes.   HISTORY  Chief Complaint Headache    HPI Cory Chan is a 30 y.o. male who presents to the emergency department with 1 day of headache, body aches, cough, chest discomfort with coughing.  He is not vaccinated against influenza or COVID-19.  Unaware of any fever.  No sore throat, ear pain.  No vomiting or diarrhea.  No rash or tick bite.  No sick contacts.  No neck pain or neck stiffness.  Has not taken any medications prior to arrival.        History reviewed. No pertinent past medical history.  Patient Active Problem List   Diagnosis Date Noted   Adjustment disorder with depressed mood     History reviewed. No pertinent surgical history.  Prior to Admission medications   Medication Sig Start Date End Date Taking? Authorizing Provider  nirmatrelvir/ritonavir EUA (PAXLOVID) TABS Take 3 tablets by mouth 2 (two) times daily for 5 days. Patient GFR is >60. Take nirmatrelvir (150 mg) two tablets twice daily for 5 days and ritonavir (100 mg) one tablet twice daily for 5 days. 01/04/21 01/09/21 Yes Kaius Daino, Layla Maw, DO  naproxen (NAPROSYN) 500 MG tablet Take 1 tablet (500 mg total) by mouth 2 (two) times daily with a meal. 08/04/19   Tommi Rumps, PA-C    Allergies Patient has no known allergies.  No family history on file.  Social History Social History   Tobacco Use   Smoking status: Former    Packs/day: 0.25    Pack years: 0.00    Types: Cigarettes   Smokeless tobacco: Never  Vaping Use   Vaping Use: Never used  Substance Use Topics   Alcohol use: No   Drug use: Yes    Types: Marijuana    Comment: Daily    Review of Systems Constitutional: No fever. Eyes: No visual changes. ENT: No  sore throat. Cardiovascular: + chest pain. Respiratory: Denies shortness of breath. Gastrointestinal: No nausea, vomiting, diarrhea. Genitourinary: Negative for dysuria. Musculoskeletal: Negative for back pain. Skin: Negative for rash. Neurological: Negative for focal weakness or numbness.  ____________________________________________   PHYSICAL EXAM:  VITAL SIGNS: ED Triage Vitals  Enc Vitals Group     BP 01/04/21 0112 128/85     Pulse Rate 01/04/21 0112 (!) 102     Resp 01/04/21 0112 18     Temp 01/04/21 0112 99.6 F (37.6 C)     Temp Source 01/04/21 0112 Oral     SpO2 01/04/21 0112 97 %     Weight 01/04/21 0109 160 lb (72.6 kg)     Height 01/04/21 0109 5\' 9"  (1.753 m)     Head Circumference --      Peak Flow --      Pain Score 01/04/21 0109 8     Pain Loc --      Pain Edu? --      Excl. in GC? --    CONSTITUTIONAL: Alert and oriented and responds appropriately to questions. Well-appearing; well-nourished HEAD: Normocephalic, atraumatic EYES: Conjunctivae clear, pupils appear equal, EOM appear intact ENT: normal nose; moist mucous membranes; No pharyngeal erythema or petechiae, no tonsillar hypertrophy or exudate, no uvular deviation, no unilateral swelling, no trismus or drooling, no muffled voice, normal phonation, no stridor,  no dental caries present, no drainable dental abscess noted, no Ludwig's angina, tongue sits flat in the bottom of the mouth, no angioedema, no facial erythema or warmth, no facial swelling; no pain with movement of the neck, no cervical LAD. NECK: Supple, normal ROM, no meningismus CARD: RRR; S1 and S2 appreciated; no murmurs, no clicks, no rubs, no gallops RESP: Normal chest excursion without splinting or tachypnea; breath sounds clear and equal bilaterally; no wheezes, no rhonchi, no rales, no hypoxia or respiratory distress, speaking full sentences ABD/GI: Normal bowel sounds; non-distended; soft, non-tender, no rebound, no guarding, no  peritoneal signs, no hepatosplenomegaly BACK: The back appears normal EXT: Normal ROM in all joints; no deformity noted, no edema; no cyanosis SKIN: Normal color for age and race; warm; no rash on exposed skin NEURO: Moves all extremities equally, normal speech, no facial symmetry, normal gait PSYCH: The patient's mood and manner are appropriate.  ____________________________________________   LABS (all labs ordered are listed, but only abnormal results are displayed)  Labs Reviewed  RESP PANEL BY RT-PCR (FLU A&B, COVID) ARPGX2 - Abnormal; Notable for the following components:      Result Value   SARS Coronavirus 2 by RT PCR POSITIVE (*)    All other components within normal limits   ____________________________________________  EKG   EKG Interpretation  Date/Time:  Thursday January 04 2021 02:50:09 EDT Ventricular Rate:  87 PR Interval:  165 QRS Duration: 94 QT Interval:  328 QTC Calculation: 395 R Axis:   37 Text Interpretation: Sinus rhythm ST elev, probable normal early repol pattern Confirmed by Rochele Raring [phone]) on 01/04/2021 3:40:39 AM         ____________________________________________  RADIOLOGY Normajean Baxter Dyneisha Murchison, personally viewed and evaluated these images (plain radiographs) as part of my medical decision making, as well as reviewing the written report by the radiologist.  ED MD interpretation: Chest x-ray clear.  Official radiology report(s): DG Chest Port 1 View  Result Date: 01/04/2021 CLINICAL DATA:  Fever and cough EXAM: PORTABLE CHEST 1 VIEW COMPARISON:  None. FINDINGS: The heart size and mediastinal contours are within normal limits. Both lungs are clear. The visualized skeletal structures are unremarkable. IMPRESSION: No active disease. Electronically Signed   By: Deatra Robinson M.D.   On: 01/04/2021 03:02    ____________________________________________   PROCEDURES  Procedure(s) performed (including Critical  Care):  Procedures   ____________________________________________   INITIAL IMPRESSION / ASSESSMENT AND PLAN / ED COURSE  As part of my medical decision making, I reviewed the following data within the electronic MEDICAL RECORD NUMBER Nursing notes reviewed and incorporated, Labs reviewed , EKG interpreted , Patient signed out to , Radiograph reviewed , and Notes from prior ED visits         Patient here with likely viral illness.  Could be COVID-19 or influenza.  He agrees to testing today.  Also complains of chest soreness with coughing.  Will obtain EKG but low suspicion for ACS, PE, dissection, pericarditis, myocarditis.  We will also obtain chest x-ray to evaluate for possible pneumonia.  Will give ibuprofen for symptomatic relief and reassess.  Otherwise well-appearing, nontoxic, well-hydrated.  ED PROGRESS  EKG unremarkable.  Chest x-ray clear.  Patient reports headache, body aches have improved.  Now feeling hot and sweaty.  Called lab and patient is positive for COVID-19.   Given patient is unvaccinated, I have offered him monoclonal antibodies which he declines.  He agrees to a prescription for Paxlovid.  Discussed supportive care instructions and  return precautions.  No signs of COVID-pneumonia on chest x-ray.  No hypoxia, respiratory distress or increased work of breathing.I do not feel he needs remdesivir, steroids.  I feel he is safe for discharge home.  Have encouraged him to get vaccinated as an outpatient.We discussed the importance of quarantine, isolation.  Will provide with work note.  At this time, I do not feel there is any life-threatening condition present. I have reviewed, interpreted and discussed all results (EKG, imaging, lab, urine as appropriate) and exam findings with patient/family. I have reviewed nursing notes and appropriate previous records.  I feel the patient is safe to be discharged home without further emergent workup and can continue workup as an outpatient  as needed. Discussed usual and customary return precautions. Patient/family verbalize understanding and are comfortable with this plan.  Outpatient follow-up has been provided as needed. All questions have been answered.  ____________________________________________   FINAL CLINICAL IMPRESSION(S) / ED DIAGNOSES  Final diagnoses:  COVID-19     ED Discharge Orders          Ordered    nirmatrelvir/ritonavir EUA (PAXLOVID) TABS  2 times daily        01/04/21 0422            *Please note:  Cory Chan was evaluated in Emergency Department on 01/04/2021 for the symptoms described in the history of present illness. He was evaluated in the context of the global COVID-19 pandemic, which necessitated consideration that the patient might be at risk for infection with the SARS-CoV-2 virus that causes COVID-19. Institutional protocols and algorithms that pertain to the evaluation of patients at risk for COVID-19 are in a state of rapid change based on information released by regulatory bodies including the CDC and federal and state organizations. These policies and algorithms were followed during the patient's care in the ED.  Some ED evaluations and interventions may be delayed as a result of limited staffing during and the pandemic.*   Note:  This document was prepared using Dragon voice recognition software and may include unintentional dictation errors.    Oracio Galen, Layla Maw, DO 01/04/21 [phone]

## 2021-02-14 ENCOUNTER — Encounter: Payer: Self-pay | Admitting: Emergency Medicine

## 2021-02-14 ENCOUNTER — Emergency Department
Admission: EM | Admit: 2021-02-14 | Discharge: 2021-02-14 | Discharge status: Against Medical Advice | Payer: Self-pay | Attending: Emergency Medicine | Admitting: Emergency Medicine

## 2021-02-14 ENCOUNTER — Other Ambulatory Visit: Payer: Self-pay

## 2021-02-14 DIAGNOSIS — L0291 Cutaneous abscess, unspecified: Secondary | ICD-10-CM

## 2021-02-14 DIAGNOSIS — Z87891 Personal history of nicotine dependence: Secondary | ICD-10-CM | POA: Insufficient documentation

## 2021-02-14 DIAGNOSIS — L02216 Cutaneous abscess of umbilicus: Secondary | ICD-10-CM | POA: Insufficient documentation

## 2021-02-14 NOTE — Discharge Instructions (Addendum)
Please return to the emergency department as soon as possible to have work-up for umbilical abscess.

## 2021-02-14 NOTE — ED Provider Notes (Signed)
[facility]-EMERGENCY DEPARTMENT  ____________________________________________  Time seen: Approximately 7:24 PM  I have reviewed the triage vital signs and the nursing notes.   HISTORY  Chief Complaint Abscess   Historian Patient     HPI Cory Chan is a 30 y.o. male presents to the emergency department with a concern for an abscess of the umbilicus.  Patient states that area has become more swollen and he has noticed a scant amount of purulent discharge.  He denies a history of similar abscesses in the past.  Patient states he would like to leave the emergency department because he has waited too long and will return at that time   History reviewed. No pertinent past medical history.   Immunizations up to date:  Yes.     History reviewed. No pertinent past medical history.  Patient Active Problem List   Diagnosis Date Noted   Adjustment disorder with depressed mood     History reviewed. No pertinent surgical history.  Prior to Admission medications   Medication Sig Start Date End Date Taking? Authorizing Provider  naproxen (NAPROSYN) 500 MG tablet Take 1 tablet (500 mg total) by mouth 2 (two) times daily with a meal. 08/04/19   Tommi Rumps, PA-C    Allergies Patient has no known allergies.  History reviewed. No pertinent family history.  Social History Social History   Tobacco Use   Smoking status: Former    Packs/day: 0.25    Types: Cigarettes   Smokeless tobacco: Never  Vaping Use   Vaping Use: Never used  Substance Use Topics   Alcohol use: No   Drug use: Yes    Types: Marijuana    Comment: Daily     Review of Systems  Constitutional: No fever/chills Eyes:  No discharge ENT: No upper respiratory complaints. Respiratory: no cough. No SOB/ use of accessory muscles to breath Gastrointestinal:   No nausea, no vomiting.  No diarrhea.  No constipation. Musculoskeletal: Negative for musculoskeletal pain. Skin: Patient has abscess.      ____________________________________________   PHYSICAL EXAM:  VITAL SIGNS: ED Triage Vitals  Enc Vitals Group     BP 02/14/21 1634 131/80     Pulse Rate 02/14/21 1634 64     Resp 02/14/21 1634 18     Temp 02/14/21 1634 98.4 F (36.9 C)     Temp Source 02/14/21 1634 Oral     SpO2 02/14/21 1634 99 %     Weight 02/14/21 1638 160 lb 0.9 oz (72.6 kg)     Height 02/14/21 1634 5\' 9"  (1.753 m)     Head Circumference --      Peak Flow --      Pain Score 02/14/21 1638 8     Pain Loc --      Pain Edu? --      Excl. in GC? --      Constitutional: Alert and oriented. Well appearing and in no acute distress. Eyes: Conjunctivae are normal. PERRL. EOMI. Head: Atraumatic. Cardiovascular: Normal rate, regular rhythm. Normal S1 and S2.  Good peripheral circulation. Respiratory: Normal respiratory effort without tachypnea or retractions. Lungs CTAB. Good air entry to the bases with no decreased or absent breath sounds Gastrointestinal: Bowel sounds x 4 quadrants.  Patient's umbilicus appears edematous with mild overlying erythema.  Patient will not allow extensive palpation due to pain. Musculoskeletal: Full range of motion to all extremities. No obvious deformities noted Neurologic:  Normal for age. No gross focal neurologic deficits are appreciated.  Skin:  Skin is warm, dry and intact. No rash noted. Psychiatric: Mood and affect are normal for age. Speech and behavior are normal.   ____________________________________________   LABS (all labs ordered are listed, but only abnormal results are displayed)  Labs Reviewed - No data to display ____________________________________________  EKG   ____________________________________________  RADIOLOGY   No results found.  ____________________________________________    PROCEDURES  Procedure(s) performed:     Procedures     Medications - No data to  display   ____________________________________________   INITIAL IMPRESSION / ASSESSMENT AND PLAN / ED COURSE  Pertinent labs & imaging results that were available during my care of the patient were reviewed by me and considered in my medical decision making (see chart for details).      Assessment and Plan:  Abscess 30 year old male presents to the emergency department with an abscess of the umbilicus that he has noticed for the past week.  Patient reports he has been cleaning affected area with peroxide but states that symptoms seem to be worsening.  Vital signs are reassuring at triage.  Explained to patient that he would need an ultrasound of the affected area as umbilical abscesses are often surgical.  Patient stated that he would needed to leave the emergency department and would return when he had more time.  Cautioned patient that symptoms could worsen and he would need to leave the emergency department AGAINST MEDICAL ADVICE. Patient stated that he understood and would return when he had more time available.     ____________________________________________  FINAL CLINICAL IMPRESSION(S) / ED DIAGNOSES  Final diagnoses:  Abscess      NEW MEDICATIONS STARTED DURING THIS VISIT:  ED Discharge Orders     None           This chart was dictated using voice recognition software/Dragon. Despite best efforts to proofread, errors can occur which can change the meaning. Any change was purely unintentional.     Orvil Feil, PA-C 02/14/21 1938    Merwyn Katos, MD 02/14/21 2312

## 2021-02-14 NOTE — ED Triage Notes (Signed)
Pt comes into the ED via POV c/o abscess at the umbilicus.  Pt has swelling of the umbilicus and the patient states it has an odor even after cleansing the area with peroxide.  Pt in NAD.

## 2021-04-08 ENCOUNTER — Emergency Department
Admission: EM | Admit: 2021-04-08 | Discharge: 2021-04-08 | Disposition: A | Discharge status: Home / Self Care | Payer: Self-pay | Attending: Emergency Medicine | Admitting: Emergency Medicine

## 2021-04-08 ENCOUNTER — Other Ambulatory Visit: Payer: Self-pay

## 2021-04-08 ENCOUNTER — Emergency Department: Payer: Self-pay

## 2021-04-08 DIAGNOSIS — I880 Nonspecific mesenteric lymphadenitis: Secondary | ICD-10-CM | POA: Insufficient documentation

## 2021-04-08 DIAGNOSIS — Z87891 Personal history of nicotine dependence: Secondary | ICD-10-CM | POA: Insufficient documentation

## 2021-04-08 DIAGNOSIS — N23 Unspecified renal colic: Secondary | ICD-10-CM

## 2021-04-08 DIAGNOSIS — N200 Calculus of kidney: Secondary | ICD-10-CM | POA: Insufficient documentation

## 2021-04-08 DIAGNOSIS — R1030 Lower abdominal pain, unspecified: Secondary | ICD-10-CM

## 2021-04-08 LAB — CBC WITH DIFFERENTIAL/PLATELET
Abs Immature Granulocytes: 0.01 10*3/uL (ref 0.00–0.07)
Basophils Absolute: 0.1 10*3/uL (ref 0.0–0.1)
Basophils Relative: 1 %
Eosinophils Absolute: 0.2 10*3/uL (ref 0.0–0.5)
Eosinophils Relative: 2 %
HCT: 46.3 % (ref 39.0–52.0)
HCT: 46.9 % (ref 39.0–52.0)
Hemoglobin: 15.1 g/dL (ref 13.0–17.0)
Hemoglobin: 15.3 g/dL (ref 13.0–17.0)
Immature Granulocytes: 0 %
Lymphocytes Relative: 43 %
Lymphs Abs: 2.7 10*3/uL (ref 0.7–4.0)
MCH: 26.7 pg (ref 26.0–34.0)
MCH: 26.9 pg (ref 26.0–34.0)
MCHC: 32.6 g/dL (ref 30.0–36.0)
MCHC: 32.6 g/dL (ref 30.0–36.0)
MCV: 81.8 fL (ref 80.0–100.0)
MCV: 82.4 fL (ref 80.0–100.0)
Monocytes Absolute: 0.7 10*3/uL (ref 0.1–1.0)
Monocytes Relative: 12 %
Neutro Abs: 2.7 10*3/uL (ref 1.7–7.7)
Neutrophils Relative %: 42 %
Platelets: 312 10*3/uL (ref 150–400)
Platelets: 331 10*3/uL (ref 150–400)
RBC: 5.66 MIL/uL (ref 4.22–5.81)
RBC: 5.69 MIL/uL (ref 4.22–5.81)
RDW: 14.3 % (ref 11.5–15.5)
RDW: 14.6 % (ref 11.5–15.5)
WBC: 6.3 10*3/uL (ref 4.0–10.5)
WBC: 6.3 10*3/uL (ref 4.0–10.5)
nRBC: 0 % (ref 0.0–0.2)
nRBC: 0 % (ref 0.0–0.2)

## 2021-04-08 LAB — COMPREHENSIVE METABOLIC PANEL
ALT: 36 U/L (ref 0–44)
AST: 26 U/L (ref 15–41)
Albumin: 3.7 g/dL (ref 3.5–5.0)
Alkaline Phosphatase: 90 U/L (ref 38–126)
Anion gap: 7 (ref 5–15)
BUN: 9 mg/dL (ref 6–20)
CO2: 28 mmol/L (ref 22–32)
Calcium: 8.8 mg/dL — ABNORMAL LOW (ref 8.9–10.3)
Chloride: 104 mmol/L (ref 98–111)
Creatinine, Ser: 0.92 mg/dL (ref 0.61–1.24)
GFR, Estimated: >60 mL/min (ref >60)
Glucose, Bld: 110 mg/dL — ABNORMAL HIGH (ref 70–99)
Potassium: 3.9 mmol/L (ref 3.5–5.1)
Sodium: 139 mmol/L (ref 135–145)
Total Bilirubin: 0.5 mg/dL (ref 0.3–1.2)
Total Protein: 7.1 g/dL (ref 6.5–8.1)

## 2021-04-08 LAB — URINALYSIS, COMPLETE (UACMP) WITH MICROSCOPIC
Bacteria, UA: NONE SEEN
Bilirubin Urine: NEGATIVE
Glucose, UA: NEGATIVE mg/dL
Ketones, ur: NEGATIVE mg/dL
Leukocytes,Ua: NEGATIVE
Nitrite: NEGATIVE
Protein, ur: NEGATIVE mg/dL
RBC / HPF: >50 RBC/hpf — ABNORMAL HIGH (ref 0–5)
Specific Gravity, Urine: 1.017 (ref 1.005–1.030)
pH: 6 (ref 5.0–8.0)

## 2021-04-08 LAB — CBC
HCT: 47.2 % (ref 39.0–52.0)
Hemoglobin: 15.2 g/dL (ref 13.0–17.0)
MCH: 26.3 pg (ref 26.0–34.0)
MCHC: 32.2 g/dL (ref 30.0–36.0)
MCV: 81.5 fL (ref 80.0–100.0)
Platelets: 300 10*3/uL (ref 150–400)
RBC: 5.79 MIL/uL (ref 4.22–5.81)
RDW: 14.3 % (ref 11.5–15.5)
WBC: 6.4 10*3/uL (ref 4.0–10.5)
nRBC: 0 % (ref 0.0–0.2)

## 2021-04-08 LAB — LIPASE, BLOOD: Lipase: 34 U/L (ref 11–51)

## 2021-04-08 MED ORDER — MORPHINE SULFATE (PF) 4 MG/ML IV SOLN
4.0000 mg | Freq: Once | INTRAVENOUS | Status: AC
  Administered 2021-04-08: 4 mg via INTRAVENOUS
  Filled 2021-04-08: qty 1

## 2021-04-08 MED ORDER — HYDROCODONE-ACETAMINOPHEN 5-325 MG PO TABS: 1.0000 | ORAL_TABLET | Freq: Four times a day (QID) | ORAL | 0 refills | Status: DC | PRN

## 2021-04-08 MED ORDER — ONDANSETRON HCL 4 MG/2ML IJ SOLN
4.0000 mg | Freq: Once | INTRAMUSCULAR | Status: AC
  Administered 2021-04-08: 4 mg via INTRAVENOUS
  Filled 2021-04-08: qty 2

## 2021-04-08 NOTE — ED Provider Notes (Signed)
[facility] Emergency Department Provider Note   ____________________________________________   Event Date/Time   First MD Initiated Contact with Patient 04/08/21 1034     (approximate)  I have reviewed the triage vital signs and the nursing notes.   HISTORY  Chief Complaint Abdominal Pain    HPI Cory Chan is a 30 y.o. male patient woke up this morning with sudden onset severe crampy lower abdominal pain.  It radiates to his back.  There is no other pain elsewhere.  He is not having any vomiting.  He is not having any diarrhea.  Pain is made worse by the bumps in the road.  Patient is never had a thing like this before.         History reviewed. No pertinent past medical history.  Patient Active Problem List   Diagnosis Date Noted   Adjustment disorder with depressed mood     History reviewed. No pertinent surgical history.  Prior to Admission medications   Medication Sig Start Date End Date Taking? Authorizing Provider  HYDROcodone-acetaminophen (NORCO/VICODIN) 5-325 MG tablet Take 1 tablet by mouth every 6 (six) hours as needed for moderate pain. 04/08/21  Yes Arnaldo Natal, MD  naproxen (NAPROSYN) 500 MG tablet Take 1 tablet (500 mg total) by mouth 2 (two) times daily with a meal. 08/04/19   Tommi Rumps, PA-C    Allergies Patient has no known allergies.  History reviewed. No pertinent family history.  Social History Social History   Tobacco Use   Smoking status: Former    Packs/day: 0.25    Types: Cigarettes   Smokeless tobacco: Never  Vaping Use   Vaping Use: Never used  Substance Use Topics   Alcohol use: No   Drug use: Yes    Types: Marijuana    Comment: Daily    Review of Systems  Constitutional: No fever/chills Eyes: No visual changes. ENT: No sore throat. Cardiovascular: Denies chest pain. Respiratory: Denies shortness of breath. Gastrointestinal: abdominal pain.  No nausea, no vomiting.  No diarrhea.   No constipation. Genitourinary: Negative for dysuria. Musculoskeletal: Negative for back pain.  But pain radiates to the back. Skin: Negative for rash. Neurological: Negative for headaches, focal weakness   ____________________________________________   PHYSICAL EXAM:  VITAL SIGNS: ED Triage Vitals  Enc Vitals Group     BP --      Pulse Rate 04/08/21 1016 78     Resp 04/08/21 1016 20     Temp 04/08/21 1016 98.6 F (37 C)     Temp Source 04/08/21 1016 Oral     SpO2 04/08/21 1016 95 %     Weight 04/08/21 1017 190 lb (86.2 kg)     Height 04/08/21 1017 5\' 9"  (1.753 m)     Head Circumference --      Peak Flow --      Pain Score 04/08/21 1014 10     Pain Loc --      Pain Edu? --      Excl. in GC? --     Constitutional: Alert and oriented.  Currently well appearing and in no acute distress.  Nursing note reports that earlier he was crying. Eyes: Conjunctivae are normal.  Head: Atraumatic. Nose: No congestion/rhinnorhea. Mouth/Throat: Mucous membranes are moist.  Oropharynx non-erythematous. Neck: No stridor.   Cardiovascular: Normal rate, regular rhythm. Grossly normal heart sounds.  Good peripheral circulation. Respiratory: Normal respiratory effort.  No retractions. Lungs CTAB. Gastrointestinal: Soft and nontender. No distention.  No abdominal bruits.  Musculoskeletal: No lower extremity tenderness nor edema.   Neurologic:  Normal speech and language. No gross focal neurologic deficits are appreciated.  Skin:  Skin is warm, dry and intact. No rash noted.   ____________________________________________   LABS (all labs ordered are listed, but only abnormal results are displayed)  Labs Reviewed  COMPREHENSIVE METABOLIC PANEL - Abnormal; Notable for the following components:      Result Value   Glucose, Bld 110 (*)    Calcium 8.8 (*)    All other components within normal limits  URINALYSIS, COMPLETE (UACMP) WITH MICROSCOPIC - Abnormal; Notable for the following  components:   Color, Urine YELLOW (*)    APPearance HAZY (*)    Hgb urine dipstick LARGE (*)    RBC / HPF >50 (*)    All other components within normal limits  LIPASE, BLOOD  CBC  CBC WITH DIFFERENTIAL/PLATELET  DIFFERENTIAL   ____________________________________________  EKG   ____________________________________________  RADIOLOGY Jill Poling, personally viewed and evaluated these images (plain radiographs) as part of my medical decision making, as well as reviewing the written report by the radiologist.  ED MD interpretation:    Official radiology report(s): CT Renal Stone Study  Result Date: 04/08/2021 CLINICAL DATA:  Flank pain, suspected kidney stone, sudden onset of lower abdominal pain this morning EXAM: CT ABDOMEN AND PELVIS WITHOUT CONTRAST TECHNIQUE: Multidetector CT imaging of the abdomen and pelvis was performed following the standard protocol without IV contrast. Sagittal and coronal MPR images reconstructed from axial data set. No oral contrast administered. No oral contrast administered. COMPARISON:  None FINDINGS: Lower chest: Lung bases clear Hepatobiliary: Gallbladder and liver normal appearance Pancreas: Normal appearance Spleen: Normal appearance Adrenals/Urinary Tract: Adrenal glands, LEFT kidney, and LEFT ureter normal appearance. No RIGHT renal mass identified. Minimal prominence of RIGHT renal collecting system. Nondistended RIGHT ureter. Probable tiny distal RIGHT ureteral calculus image 73. Bladder unremarkable. Stomach/Bowel: Colon is under distended with suboptimal assessment of wall thickness. Normal appendix. Stomach and bowel loops otherwise normal appearance. Vascular/Lymphatic: Aorta normal caliber. Numerous upper normal sized to mildly enlarged inguinal lymph nodes bilaterally up to 14 mm diameter. No definite intra-abdominal or intrapelvic adenopathy. Reproductive: Unremarkable prostate gland and seminal vesicles Other: No free air or free fluid.  No inflammatory process. 13 x 10 mm nodule at umbilicus, recommend correlation with physical exam. Musculoskeletal: Unremarkable IMPRESSION: Minimal prominence of RIGHT renal collecting system and ureter with a probable tiny distal RIGHT ureteral calculus. Numerous upper normal sized to mildly enlarged inguinal lymph nodes bilaterally up to 14 mm diameter, of uncertain etiology; these could be reactive but clinical follow-up until resolution recommended to exclude other etiologies including lymphoma and tumor. No other intra-abdominal or intrapelvic abnormalities. 13 x 10 mm nodule at umbilicus recommend correlation with physical exam to exclude mass. Electronically Signed   By: Ulyses Southward M.D.   On: 04/08/2021 11:12    ____________________________________________   PROCEDURES  Procedure(s) performed (including Critical Care):  Procedures   ____________________________________________   INITIAL IMPRESSION / ASSESSMENT AND PLAN / ED COURSE  ----------------------------------------- 12:14 PM on 04/08/2021 ----------------------------------------- Patient's pain is now gone.  Patient's umbilicus is very large with a large knot that was tied when he was born.  I discussed with him the need for follow-up for his mesenteric adenitis.  He has no insurance but I have told him the number of places he can follow-up for reduced cost.  He can always return here if he  cannot do anything else.  I will give him some Vicodin if needed for the stone but the way the pain is gone it likely already passed being so small.  Patient will also return if he feels sick or has nausea vomiting high fever or any other problems.              ____________________________________________   FINAL CLINICAL IMPRESSION(S) / ED DIAGNOSES  Final diagnoses:  Lower abdominal pain  Renal colic on right side  Mesenteric adenitis     ED Discharge Orders          Ordered    HYDROcodone-acetaminophen  (NORCO/VICODIN) 5-325 MG tablet  Every 6 hours PRN        04/08/21 1213             Note:  This document was prepared using Dragon voice recognition software and may include unintentional dictation errors.    Arnaldo Natal, MD 04/08/21 1215

## 2021-04-08 NOTE — ED Notes (Signed)
Follow up pcp info provided all questions answered, pt axox4

## 2021-04-08 NOTE — Discharge Instructions (Addendum)
You have a very small kidney stone on the right side this is probably what was causing the pain.  Also caused a little bit of blood in your urine.  Please follow-up with Dr. Lonna Cobb the urologist.  Give his office a call in the morning and let him know you are in the emergency room with some blood in the urine and a kidney stone he should be out of follow you up in the office in a week or 2.  Please return here for worse pain, fever or vomiting or feeling sicker.  If the pain returns and is not severe you can try some Vicodin 1 pill 4 times a day as needed for pain.  Be careful that can make you constipated and woozy.  Do not drive on it.  If the police catch you driving on it you will be considered an impaired driver.  You also have some enlarged lymph nodes or swollen glands in your belly.  These will need to be followed up in about a month or 2 to make sure they have resolved.  You can follow-up with [facility] health care or the [facility] or the [facility] clinic or [facility] or [facility] charity care.  [facility] also has a residence clinic to see you for reduced cost.  If you cannot get anyone to see you please return here for recheck.

## 2021-04-08 NOTE — ED Triage Notes (Signed)
Pt c/o lower abd pain since last night, denies N/V/D. Pt is tearful on arrival

## 2021-04-11 ENCOUNTER — Encounter: Payer: Self-pay | Admitting: Emergency Medicine

## 2021-04-11 ENCOUNTER — Emergency Department
Admission: EM | Admit: 2021-04-11 | Discharge: 2021-04-11 | Disposition: A | Discharge status: Home / Self Care | Payer: Self-pay | Attending: Emergency Medicine | Admitting: Emergency Medicine

## 2021-04-11 ENCOUNTER — Other Ambulatory Visit: Payer: Self-pay

## 2021-04-11 DIAGNOSIS — Z87891 Personal history of nicotine dependence: Secondary | ICD-10-CM | POA: Insufficient documentation

## 2021-04-11 DIAGNOSIS — N23 Unspecified renal colic: Secondary | ICD-10-CM | POA: Insufficient documentation

## 2021-04-11 LAB — URINALYSIS, COMPLETE (UACMP) WITH MICROSCOPIC
Bacteria, UA: NONE SEEN
Bilirubin Urine: NEGATIVE
Glucose, UA: NEGATIVE mg/dL
Hgb urine dipstick: NEGATIVE
Ketones, ur: NEGATIVE mg/dL
Leukocytes,Ua: NEGATIVE
Nitrite: NEGATIVE
Protein, ur: NEGATIVE mg/dL
Specific Gravity, Urine: 1.021 (ref 1.005–1.030)
Squamous Epithelial / HPF: NONE SEEN (ref 0–5)
pH: 5 (ref 5.0–8.0)

## 2021-04-11 LAB — CBC WITH DIFFERENTIAL/PLATELET
Abs Immature Granulocytes: 0.02 10*3/uL (ref 0.00–0.07)
Basophils Absolute: 0 10*3/uL (ref 0.0–0.1)
Basophils Relative: 1 %
Eosinophils Absolute: 0.1 10*3/uL (ref 0.0–0.5)
Eosinophils Relative: 2 %
HCT: 45.5 % (ref 39.0–52.0)
Hemoglobin: 15 g/dL (ref 13.0–17.0)
Immature Granulocytes: 0 %
Lymphocytes Relative: 44 %
Lymphs Abs: 2.9 10*3/uL (ref 0.7–4.0)
MCH: 27 pg (ref 26.0–34.0)
MCHC: 33 g/dL (ref 30.0–36.0)
MCV: 81.8 fL (ref 80.0–100.0)
Monocytes Absolute: 0.6 10*3/uL (ref 0.1–1.0)
Monocytes Relative: 9 %
Neutro Abs: 3 10*3/uL (ref 1.7–7.7)
Neutrophils Relative %: 44 %
Platelets: 293 10*3/uL (ref 150–400)
RBC: 5.56 MIL/uL (ref 4.22–5.81)
RDW: 14.5 % (ref 11.5–15.5)
WBC: 6.7 10*3/uL (ref 4.0–10.5)
nRBC: 0 % (ref 0.0–0.2)

## 2021-04-11 LAB — BASIC METABOLIC PANEL
Anion gap: 7 (ref 5–15)
BUN: 14 mg/dL (ref 6–20)
CO2: 27 mmol/L (ref 22–32)
Calcium: 8.9 mg/dL (ref 8.9–10.3)
Chloride: 103 mmol/L (ref 98–111)
Creatinine, Ser: 1.31 mg/dL — ABNORMAL HIGH (ref 0.61–1.24)
GFR, Estimated: >60 mL/min (ref >60)
Glucose, Bld: 119 mg/dL — ABNORMAL HIGH (ref 70–99)
Potassium: 4.5 mmol/L (ref 3.5–5.1)
Sodium: 137 mmol/L (ref 135–145)

## 2021-04-11 MED ORDER — SODIUM CHLORIDE 0.9 % IV BOLUS
1000.0000 mL | Freq: Once | INTRAVENOUS | Status: AC
  Administered 2021-04-11: 1000 mL via INTRAVENOUS

## 2021-04-11 MED ORDER — KETOROLAC TROMETHAMINE 30 MG/ML IJ SOLN
15.0000 mg | Freq: Once | INTRAMUSCULAR | Status: AC
  Administered 2021-04-11: 15 mg via INTRAVENOUS
  Filled 2021-04-11: qty 1

## 2021-04-11 MED ORDER — HYDROMORPHONE HCL 1 MG/ML IJ SOLN
0.5000 mg | Freq: Once | INTRAMUSCULAR | Status: AC
  Administered 2021-04-11: 0.5 mg via INTRAVENOUS
  Filled 2021-04-11: qty 1

## 2021-04-11 MED ORDER — ONDANSETRON HCL 4 MG/2ML IJ SOLN
4.0000 mg | Freq: Once | INTRAMUSCULAR | Status: AC
  Administered 2021-04-11: 4 mg via INTRAVENOUS
  Filled 2021-04-11: qty 2

## 2021-04-11 NOTE — ED Triage Notes (Signed)
Pt to ED via EMS from home c/o right flank pain.  Seen on 10/9 and diagnosed with kidney stone and given hydrocodone to take at home, has only taken one pill since which was 3-4hr PTA.  Pt had n/v x1 today.  Still urinating but dark yellow.  EMS vitals 127/83 BP, 99% RA, 65 HR.  Pt A&Ox4, chest rise even and unlabored, in NAD at this time.

## 2021-04-11 NOTE — Discharge Instructions (Signed)
Take Ibuprofen as needed for pain, Norco which you were previously prescribed as needed for more severe pain.  Drink bottled or filtered water daily.  Return to the ER for worsening symptoms, persistent vomiting, difficulty breathing or other concerns.

## 2021-04-11 NOTE — ED Provider Notes (Signed)
[facility] Emergency Department Provider Note   ____________________________________________   Event Date/Time   First MD Initiated Contact with Patient 04/11/21 0214     (approximate)  I have reviewed the triage vital signs and the nursing notes.   HISTORY  Chief Complaint Nephrolithiasis     HPI Cory Chan is a 30 y.o. male brought to the ED via EMS from home with a chief complaint of right flank pain.  He was seen in the ED on 10/9 and diagnosed with kidney stone via CT scan.  Given hydrocodone to take at home and he has only needed to take 1 pill which was 3 to 4 hours prior to arrival.  Symptoms associated with 1 episode of nausea/vomiting.  Experiencing some urinary difficulty.  Denies fever, chills, cough, chest pain, shortness of breath, testicular pain or swelling.     Past medical history Kidney stone  Patient Active Problem List   Diagnosis Date Noted   Adjustment disorder with depressed mood     History reviewed. No pertinent surgical history.  Prior to Admission medications   Medication Sig Start Date End Date Taking? Authorizing Provider  HYDROcodone-acetaminophen (NORCO/VICODIN) 5-325 MG tablet Take 1 tablet by mouth every 6 (six) hours as needed for moderate pain. 04/08/21  Yes Arnaldo Natal, MD    Allergies Patient has no known allergies.  History reviewed. No pertinent family history.  Social History Social History   Tobacco Use   Smoking status: Former    Packs/day: 0.25    Types: Cigarettes   Smokeless tobacco: Never  Vaping Use   Vaping Use: Never used  Substance Use Topics   Alcohol use: No   Drug use: Yes    Types: Marijuana    Comment: Daily    Review of Systems  Constitutional: No fever/chills Eyes: No visual changes. ENT: No sore throat. Cardiovascular: Denies chest pain. Respiratory: Denies shortness of breath. Gastrointestinal: Positive for right flank pain.  No abdominal pain.  Positive  for 1 episode of emesis.  No diarrhea.  No constipation. Genitourinary: Negative for dysuria. Musculoskeletal: Negative for back pain. Skin: Negative for rash. Neurological: Negative for headaches, focal weakness or numbness.   ____________________________________________   PHYSICAL EXAM:  VITAL SIGNS: ED Triage Vitals  Enc Vitals Group     BP      Pulse      Resp      Temp      Temp src      SpO2      Weight      Height      Head Circumference      Peak Flow      Pain Score      Pain Loc      Pain Edu?      Excl. in GC?     Constitutional: Alert and oriented. Well appearing and in mild acute distress. Eyes: Conjunctivae are normal. PERRL. EOMI. Head: Atraumatic. Nose: No congestion/rhinnorhea. Mouth/Throat: Mucous membranes are moist.   Neck: No stridor.   Cardiovascular: Normal rate, regular rhythm. Grossly normal heart sounds.  Good peripheral circulation. Respiratory: Normal respiratory effort.  No retractions. Lungs CTAB. Gastrointestinal: Soft and nontender to light or deep palpation. No distention. No abdominal bruits. No CVA tenderness. Musculoskeletal: No lower extremity tenderness nor edema.  No joint effusions. Neurologic:  Normal speech and language. No gross focal neurologic deficits are appreciated. No gait instability. Skin:  Skin is warm, dry and intact. No rash noted. Psychiatric:  Mood and affect are normal. Speech and behavior are normal.  ____________________________________________   LABS (all labs ordered are listed, but only abnormal results are displayed)  Labs Reviewed  BASIC METABOLIC PANEL - Abnormal; Notable for the following components:      Result Value   Glucose, Bld 119 (*)    Creatinine, Ser 1.31 (*)    All other components within normal limits  URINALYSIS, COMPLETE (UACMP) WITH MICROSCOPIC - Abnormal; Notable for the following components:   Color, Urine YELLOW (*)    APPearance CLEAR (*)    All other components within normal  limits  CBC WITH DIFFERENTIAL/PLATELET   ____________________________________________  EKG  None ____________________________________________  RADIOLOGY I, Rion Schnitzer J, personally viewed and evaluated these images (plain radiographs) as part of my medical decision making, as well as reviewing the written report by the radiologist.  ED MD interpretation: None  Official radiology report(s): No results found.  ____________________________________________   PROCEDURES  Procedure(s) performed (including Critical Care):  Procedures   ____________________________________________   INITIAL IMPRESSION / ASSESSMENT AND PLAN / ED COURSE  As part of my medical decision making, I reviewed the following data within the electronic MEDICAL RECORD NUMBER Nursing notes reviewed and incorporated, Labs reviewed, Old chart reviewed, Radiograph reviewed, and Notes from prior ED visits     30 year old male returning to the ED for episode of right flank pain, recently diagnosed with right renal colic. Differential diagnosis includes, but is not limited to, acute appendicitis, renal colic, testicular torsion, urinary tract infection/pyelonephritis, prostatitis,  epididymitis, diverticulitis, small bowel obstruction or ileus, colitis, abdominal aortic aneurysm, gastroenteritis, hernia, etc.   Obtain lab work, UA.  Initiate IV fluid resuscitation, IV analgesia, antiemetic.  Will reassess.  Clinical Course as of 04/11/21 1829  Wed Apr 11, 2021  0435 Pain improved.  Updated patient on all lab results.  He still has plenty of hydrocodone at home as he has only taken 1 tablet.  Strict return precautions given.  Patient verbalizes understanding agrees with plan of care. [JS]    Clinical Course User Index [JS] Irean Hong, MD     ____________________________________________   FINAL CLINICAL IMPRESSION(S) / ED DIAGNOSES  Final diagnoses:  Renal colic on right side     ED Discharge Orders      None        Note:  This document was prepared using Dragon voice recognition software and may include unintentional dictation errors.    Irean Hong, MD 04/11/21 [phone]

## 2021-04-13 ENCOUNTER — Emergency Department
Admission: EM | Admit: 2021-04-13 | Discharge: 2021-04-13 | Disposition: A | Discharge status: Home / Self Care | Payer: Self-pay | Attending: Emergency Medicine | Admitting: Emergency Medicine

## 2021-04-13 ENCOUNTER — Encounter: Payer: Self-pay | Admitting: Emergency Medicine

## 2021-04-13 ENCOUNTER — Other Ambulatory Visit: Payer: Self-pay

## 2021-04-13 DIAGNOSIS — N200 Calculus of kidney: Secondary | ICD-10-CM | POA: Insufficient documentation

## 2021-04-13 DIAGNOSIS — Z87891 Personal history of nicotine dependence: Secondary | ICD-10-CM | POA: Insufficient documentation

## 2021-04-13 LAB — URINALYSIS, COMPLETE (UACMP) WITH MICROSCOPIC
Bacteria, UA: NONE SEEN
Bilirubin Urine: NEGATIVE
Glucose, UA: NEGATIVE mg/dL
Ketones, ur: 5 mg/dL — AB
Leukocytes,Ua: NEGATIVE
Nitrite: NEGATIVE
Protein, ur: NEGATIVE mg/dL
Specific Gravity, Urine: 1.023 (ref 1.005–1.030)
Squamous Epithelial / HPF: NONE SEEN (ref 0–5)
pH: 5 (ref 5.0–8.0)

## 2021-04-13 LAB — BASIC METABOLIC PANEL
Anion gap: 9 (ref 5–15)
BUN: 13 mg/dL (ref 6–20)
CO2: 27 mmol/L (ref 22–32)
Calcium: 8.8 mg/dL — ABNORMAL LOW (ref 8.9–10.3)
Chloride: 102 mmol/L (ref 98–111)
Creatinine, Ser: 1.35 mg/dL — ABNORMAL HIGH (ref 0.61–1.24)
GFR, Estimated: >60 mL/min (ref >60)
Glucose, Bld: 108 mg/dL — ABNORMAL HIGH (ref 70–99)
Potassium: 3.4 mmol/L — ABNORMAL LOW (ref 3.5–5.1)
Sodium: 138 mmol/L (ref 135–145)

## 2021-04-13 LAB — CBC
HCT: 42.4 % (ref 39.0–52.0)
Hemoglobin: 13.9 g/dL (ref 13.0–17.0)
MCH: 26.8 pg (ref 26.0–34.0)
MCHC: 32.8 g/dL (ref 30.0–36.0)
MCV: 81.9 fL (ref 80.0–100.0)
Platelets: 296 10*3/uL (ref 150–400)
RBC: 5.18 MIL/uL (ref 4.22–5.81)
RDW: 14.6 % (ref 11.5–15.5)
WBC: 7.2 10*3/uL (ref 4.0–10.5)
nRBC: 0 % (ref 0.0–0.2)

## 2021-04-13 MED ORDER — OXYCODONE-ACETAMINOPHEN 5-325 MG PO TABS: 2.0000 | ORAL_TABLET | Freq: Four times a day (QID) | ORAL | 0 refills | Status: DC | PRN

## 2021-04-13 MED ORDER — OXYCODONE-ACETAMINOPHEN 5-325 MG PO TABS
1.0000 | ORAL_TABLET | Freq: Once | ORAL | Status: AC
Start: 2021-04-13 — End: 2021-04-13
  Administered 2021-04-13: 1 via ORAL
  Filled 2021-04-13: qty 1

## 2021-04-13 MED ORDER — ONDANSETRON 4 MG PO TBDP
4.0000 mg | ORAL_TABLET | Freq: Once | ORAL | Status: AC
  Administered 2021-04-13: 4 mg via ORAL
  Filled 2021-04-13: qty 1

## 2021-04-13 MED ORDER — ONDANSETRON 4 MG PO TBDP: 4.0000 mg | ORAL_TABLET | Freq: Four times a day (QID) | ORAL | 0 refills | Status: DC | PRN

## 2021-04-13 MED ORDER — TAMSULOSIN HCL 0.4 MG PO CAPS: 0.4000 mg | ORAL_CAPSULE | Freq: Every day | ORAL | 0 refills | Status: DC

## 2021-04-13 MED ORDER — OXYCODONE-ACETAMINOPHEN 5-325 MG PO TABS
1.0000 | ORAL_TABLET | Freq: Once | ORAL | Status: AC
  Administered 2021-04-13: 1 via ORAL
  Filled 2021-04-13: qty 1

## 2021-04-13 NOTE — ED Provider Notes (Signed)
[facility] Emergency Department Provider Note  ____________________________________________   Event Date/Time   First MD Initiated Contact with Patient 04/13/21 [phone]     (approximate)  I have reviewed the triage vital signs and the nursing notes.   HISTORY  Chief Complaint Flank Pain    HPI Izzak Fries is a 30 y.o. male with recent diagnosis of a right kidney stone who presents to the emergency department with continued right flank pain, vomiting.  States a prescription for hydrocodone that he was given in the emergency department is not controlling his symptoms.  He denies any fevers, diarrhea, dysuria, hematuria but states he feels like he is having a hard time urinating.        History reviewed. No pertinent past medical history.  Patient Active Problem List   Diagnosis Date Noted   Adjustment disorder with depressed mood     History reviewed. No pertinent surgical history.  Prior to Admission medications   Medication Sig Start Date End Date Taking? Authorizing Provider  ondansetron (ZOFRAN ODT) 4 MG disintegrating tablet Take 1 tablet (4 mg total) by mouth every 6 (six) hours as needed for nausea or vomiting. 04/13/21  Yes Marcelis Wissner, Layla Maw, DO  oxyCODONE-acetaminophen (PERCOCET/ROXICET) 5-325 MG tablet Take 2 tablets by mouth every 6 (six) hours as needed. 04/13/21  Yes Millenia Waldvogel, Layla Maw, DO  tamsulosin (FLOMAX) 0.4 MG CAPS capsule Take 1 capsule (0.4 mg total) by mouth daily. 04/13/21  Yes Chyler Creely, Layla Maw, DO    Allergies Patient has no known allergies.  History reviewed. No pertinent family history.  Social History Social History   Tobacco Use   Smoking status: Former    Packs/day: 0.25    Types: Cigarettes   Smokeless tobacco: Never  Vaping Use   Vaping Use: Never used  Substance Use Topics   Alcohol use: No   Drug use: Yes    Types: Marijuana    Comment: Daily    Review of Systems Constitutional: No fever. Eyes: No  visual changes. ENT: No sore throat. Cardiovascular: Denies chest pain. Respiratory: Denies shortness of breath. Gastrointestinal: + nausea, vomiting.  No diarrhea. Genitourinary: Negative for dysuria. Musculoskeletal: Negative for back pain. Skin: Negative for rash. Neurological: Negative for focal weakness or numbness.  ____________________________________________   PHYSICAL EXAM:  VITAL SIGNS: ED Triage Vitals  Enc Vitals Group     BP 04/13/21 0119 137/90     Pulse Rate 04/13/21 0119 84     Resp 04/13/21 0119 18     Temp 04/13/21 0119 97.7 F (36.5 C)     Temp Source 04/13/21 0119 Oral     SpO2 04/13/21 0119 99 %     Weight 04/13/21 0120 189 lb 9.5 oz (86 kg)     Height 04/13/21 0120 5\' 9"  (1.753 m)     Head Circumference --      Peak Flow --      Pain Score 04/13/21 0120 10     Pain Loc --      Pain Edu? --      Excl. in GC? --    CONSTITUTIONAL: Alert and oriented and responds appropriately to questions. Well-appearing; well-nourished HEAD: Normocephalic EYES: Conjunctivae clear, pupils appear equal, EOM appear intact ENT: normal nose; moist mucous membranes NECK: Supple, normal ROM CARD: RRR; S1 and S2 appreciated; no murmurs, no clicks, no rubs, no gallops RESP: Normal chest excursion without splinting or tachypnea; breath sounds clear and equal bilaterally; no wheezes, no rhonchi, no  rales, no hypoxia or respiratory distress, speaking full sentences ABD/GI: Normal bowel sounds; non-distended; soft, non-tender, no rebound, no guarding, no peritoneal signs, no hepatosplenomegaly, no tenderness at McBurney's point BACK: The back appears normal, + R CVA tenderness EXT: Normal ROM in all joints; no deformity noted, no edema; no cyanosis SKIN: Normal color for age and race; warm; no rash on exposed skin NEURO: Moves all extremities equally PSYCH: The patient's mood and manner are appropriate.  ____________________________________________   LABS (all labs ordered  are listed, but only abnormal results are displayed)  Labs Reviewed  BASIC METABOLIC PANEL - Abnormal; Notable for the following components:      Result Value   Potassium 3.4 (*)    Glucose, Bld 108 (*)    Creatinine, Ser 1.35 (*)    Calcium 8.8 (*)    All other components within normal limits  URINALYSIS, COMPLETE (UACMP) WITH MICROSCOPIC - Abnormal; Notable for the following components:   Color, Urine YELLOW (*)    APPearance HAZY (*)    Hgb urine dipstick SMALL (*)    Ketones, ur 5 (*)    All other components within normal limits  CBC   ____________________________________________  EKG   ____________________________________________  RADIOLOGY I, Kevonta Phariss, personally viewed and evaluated these images (plain radiographs) as part of my medical decision making, as well as reviewing the written report by the radiologist.  ED MD interpretation:    Official radiology report(s): No results found.  ____________________________________________   PROCEDURES  Procedure(s) performed (including Critical Care):  Procedures   ____________________________________________   INITIAL IMPRESSION / ASSESSMENT AND PLAN / ED COURSE  As part of my medical decision making, I reviewed the following data within the electronic MEDICAL RECORD NUMBER Nursing notes reviewed and incorporated, Labs reviewed , Old chart reviewed, Notes from prior ED visits, and [place] Controlled Substance Database         Patient here on was continued intermittent right-sided flank pain, nausea and vomiting.  This is his third emergency department visit for the same.  Was diagnosed with prominence of the right renal collecting system with probable tiny distal right ureteral calculus on 04/08/2021 and discharged with hydrocodone.  Came back on 04/11/2021 for complaints of pain and was only taking 1 hydrocodone at a time.  Instructed that he could take more pain medication as needed.  He feels like the hydrocodone is  not controlling his pain and he is vomiting.  He states this is the same pain that he has had over the past few days.  States he feels like he is having a hard time urinating but no dysuria.  No fever.  Abdominal exam is benign.  No tenderness at McBurney's point.  Doubt appendicitis.  Doubt colitis, diverticulitis, bowel obstruction, perforation, cholelithiasis, cholecystitis, pancreatitis.  He does have some mild right CVA tenderness.  I suspect that again this is from a kidney stone.  Labs obtained in triage are unremarkable.  He does have mildly elevated creatinine that was present 2 days ago which is stable.  We will avoid NSAIDs.  No leukocytosis.  Urine shows red blood cells and a small amount of white blood cells but no bacteria or other sign of infection.  Doubt pyelonephritis.  He has received Percocet and Zofran here and states he is feeling much better.  We will give him a prescription of Percocet, Zofran and Flomax to help with symptomatic relief at home and have encouraged him to call urology for close outpatient follow-up.  Patient verbalized understanding and is comfortable with this plan.  At this time, I do not feel there is any life-threatening condition present. I have reviewed, interpreted and discussed all results (EKG, imaging, lab, urine as appropriate) and exam findings with patient/family. I have reviewed nursing notes and appropriate previous records.  I feel the patient is safe to be discharged home without further emergent workup and can continue workup as an outpatient as needed. Discussed usual and customary return precautions. Patient/family verbalize understanding and are comfortable with this plan.  Outpatient follow-up has been provided as needed. All questions have been answered.  ____________________________________________   FINAL CLINICAL IMPRESSION(S) / ED DIAGNOSES  Final diagnoses:  Kidney stone     ED Discharge Orders          Ordered     oxyCODONE-acetaminophen (PERCOCET/ROXICET) 5-325 MG tablet  Every 6 hours PRN        04/13/21 0428    ondansetron (ZOFRAN ODT) 4 MG disintegrating tablet  Every 6 hours PRN        04/13/21 0428    tamsulosin (FLOMAX) 0.4 MG CAPS capsule  Daily        04/13/21 0428            *Please note:  Larson Limones was evaluated in Emergency Department on 04/13/2021 for the symptoms described in the history of present illness. He was evaluated in the context of the global COVID-19 pandemic, which necessitated consideration that the patient might be at risk for infection with the SARS-CoV-2 virus that causes COVID-19. Institutional protocols and algorithms that pertain to the evaluation of patients at risk for COVID-19 are in a state of rapid change based on information released by regulatory bodies including the CDC and federal and state organizations. These policies and algorithms were followed during the patient's care in the ED.  Some ED evaluations and interventions may be delayed as a result of limited staffing during and the pandemic.*   Note:  This document was prepared using Dragon voice recognition software and may include unintentional dictation errors.    Sanyia Dini, Layla Maw, DO 04/13/21 [phone]

## 2021-04-13 NOTE — Discharge Instructions (Signed)

## 2021-04-13 NOTE — ED Notes (Signed)
Patient has urine cup but states he cannot void at this time. Call bell within reach and instructed to call when urine specimen able.

## 2021-04-13 NOTE — ED Triage Notes (Signed)
Pt to ED from home c/o right flank pain and recent diagnosis of kidney stone.  States urinated 1hr PTA but doesn't feel like he empties bladder.  Took hydrocodone at home around 0015 tonight.  [place] and vomiting tonight.  Pt A&Ox4, chest rise even and unlabored, in NAD at this time.

## 2021-04-17 ENCOUNTER — Telehealth: Payer: Self-pay | Admitting: Emergency Medicine

## 2021-04-17 MED ORDER — ONDANSETRON HCL 4 MG PO TABS: 4.0000 mg | ORAL_TABLET | Freq: Every day | ORAL | 1 refills | Status: AC | PRN

## 2021-04-17 MED ORDER — OXYCODONE-ACETAMINOPHEN 5-325 MG PO TABS: 1.0000 | ORAL_TABLET | Freq: Four times a day (QID) | ORAL | 0 refills | Status: AC | PRN

## 2021-04-17 MED ORDER — TAMSULOSIN HCL 0.4 MG PO CAPS: 0.4000 mg | ORAL_CAPSULE | Freq: Every day | ORAL | 0 refills | Status: AC

## 2021-04-17 NOTE — Telephone Encounter (Signed)
Pt seen for kidney stones and written for percocet, Zofran and flomax, but prescriptions did not go through. I have reordered the meds.

## 2021-04-17 NOTE — Telephone Encounter (Signed)
Patient called due to pharmacy does not have his prescriptions.  He continues to have pain that comes and goes.  Says it is bad when he has it.  He has not passed the stone and has not been to the urologist.  Says he is supposed to see them this week.  I told him to call for the appointment now, and we will work on getting the prescription situation fixed.  The [facility] [address] lost internet for a long period, and were unable to receive prescriptions during that time.

## 2022-01-23 IMAGING — CT CT RENAL STONE PROTOCOL
2 of 4 series · 16 of 46 positions shown, 18 images · non-contrast
Comparison: None

CLINICAL DATA: Flank pain, suspected kidney stone, sudden onset of
lower abdominal pain this morning

EXAM:
CT ABDOMEN AND PELVIS WITHOUT CONTRAST
TECHNIQUE: Multidetector CT imaging of the abdomen and pelvis was performed
following the standard protocol without IV contrast. Sagittal and
coronal MPR images reconstructed from axial data set. No oral
contrast administered. No oral contrast administered.

[Series 2: stone full standard · axial · 0.69mm/px · z∈[-1077,-652]mm · 13 of 93 slices shown, 15 images]
[im 4/93  soft-tissue]
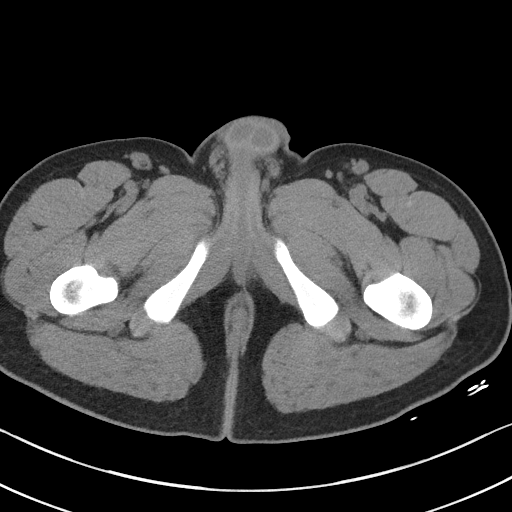
[im 4/93  bone]
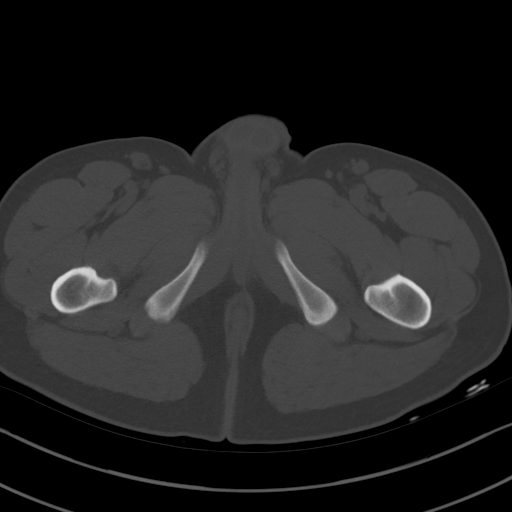
[im 12/93  soft-tissue]
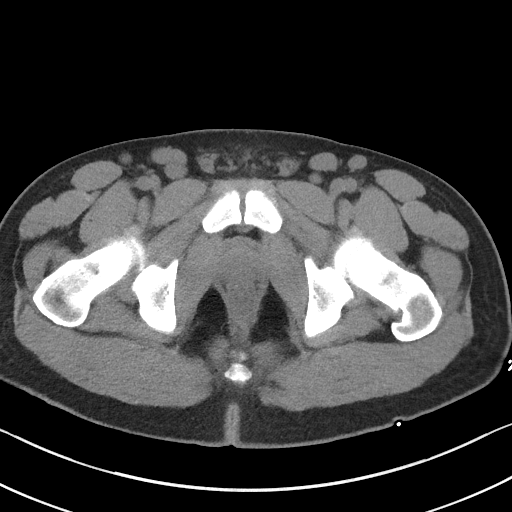
[im 20/93  soft-tissue]
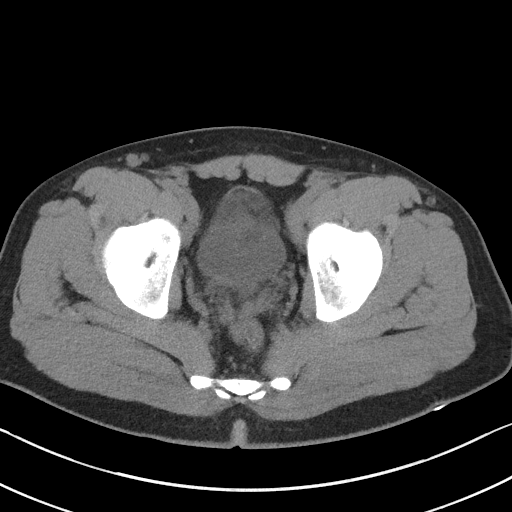
[im 27/93  soft-tissue]
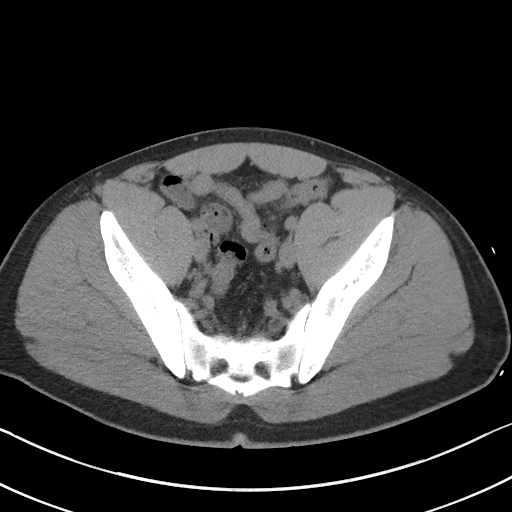
[im 31/93  soft-tissue]
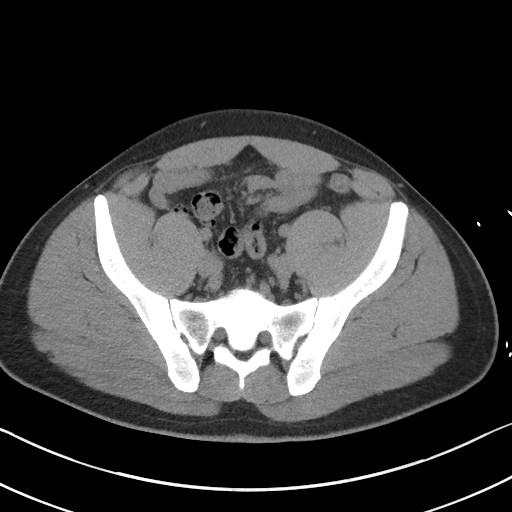
[im 39/93  soft-tissue]
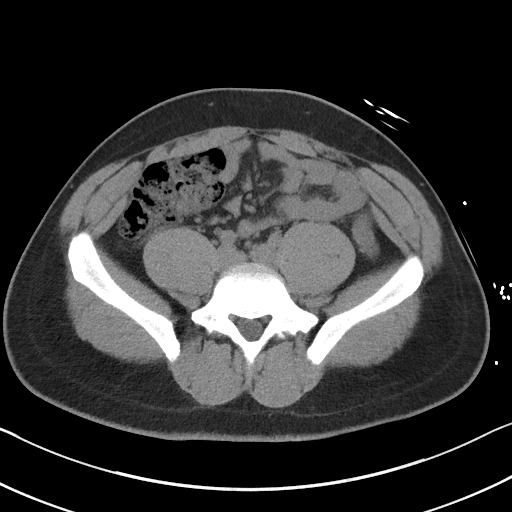
[im 47/93  soft-tissue]
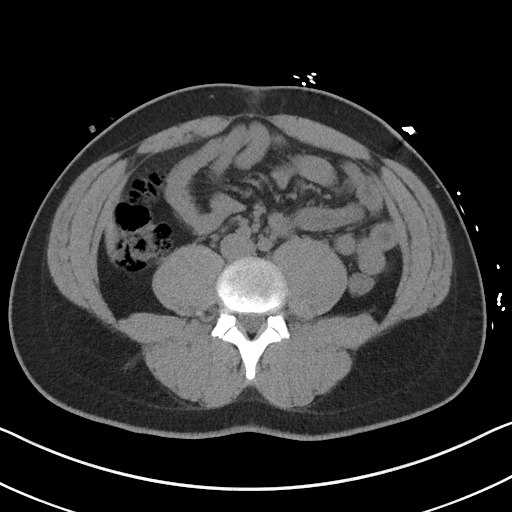
[im 54/93  soft-tissue]
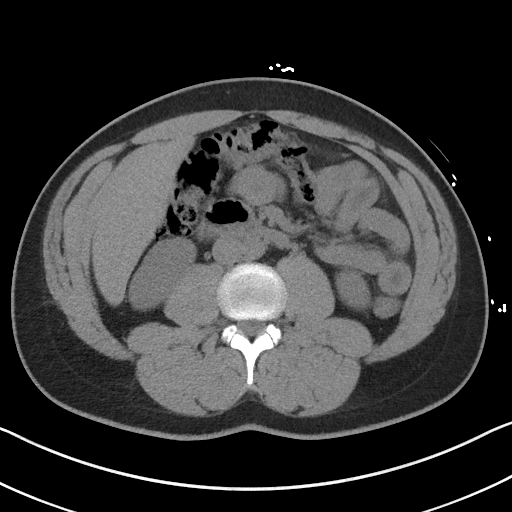
[im 62/93  soft-tissue]
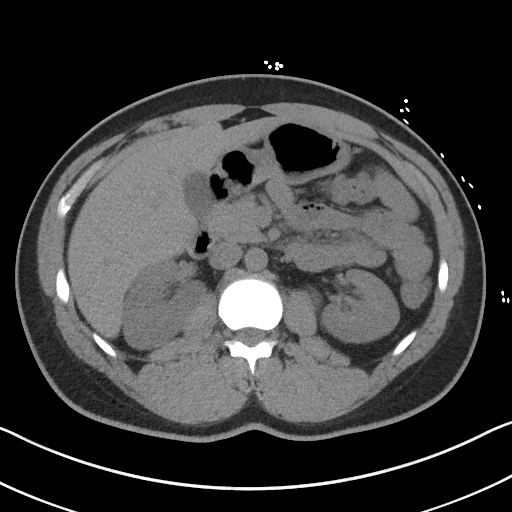
[im 62/93  bone]
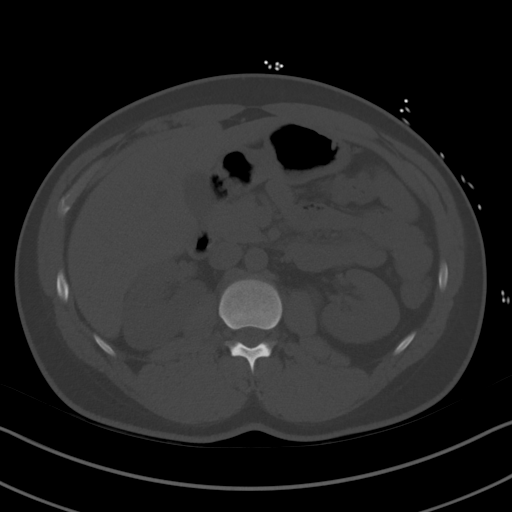
[im 66/93  soft-tissue]
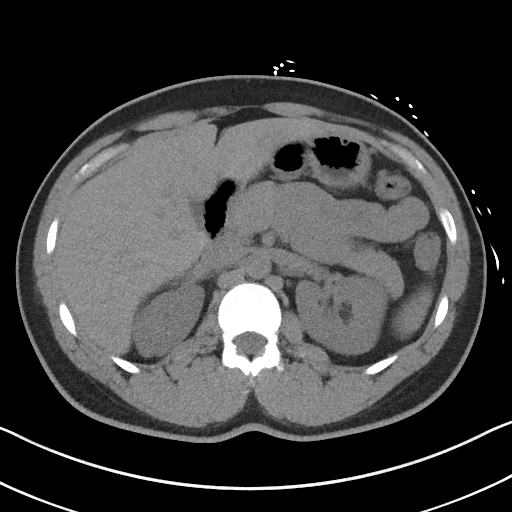
[im 73/93  soft-tissue]
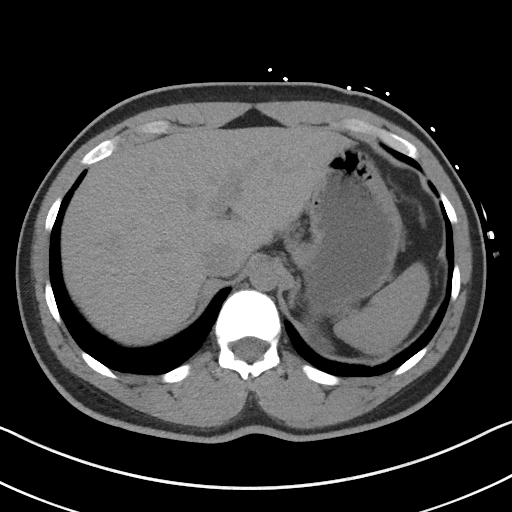
[im 81/93  soft-tissue]
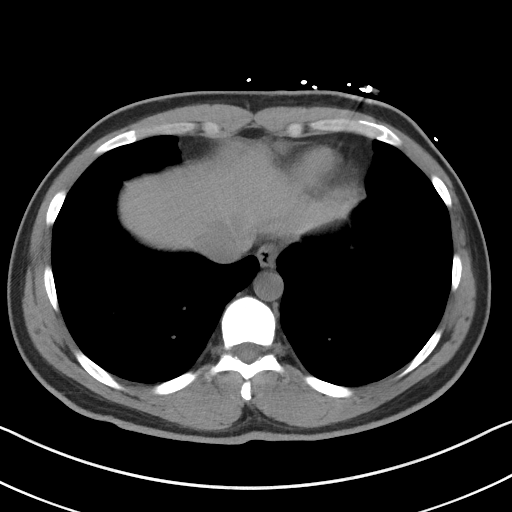
[im 89/93  soft-tissue]
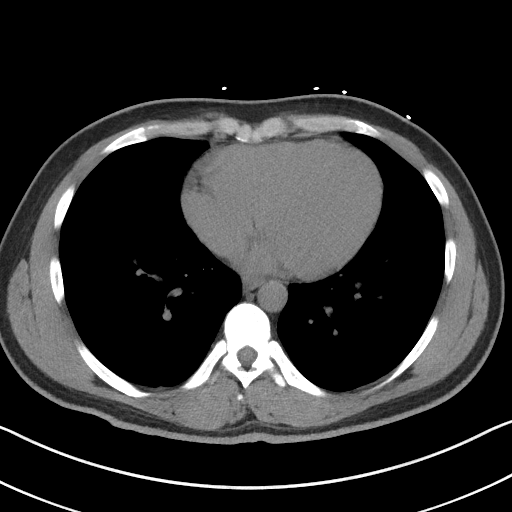

[Series 5: coronal · coronal · 0.81mm/px · 3 of 134 slices shown]
[im 45/134  soft-tissue]
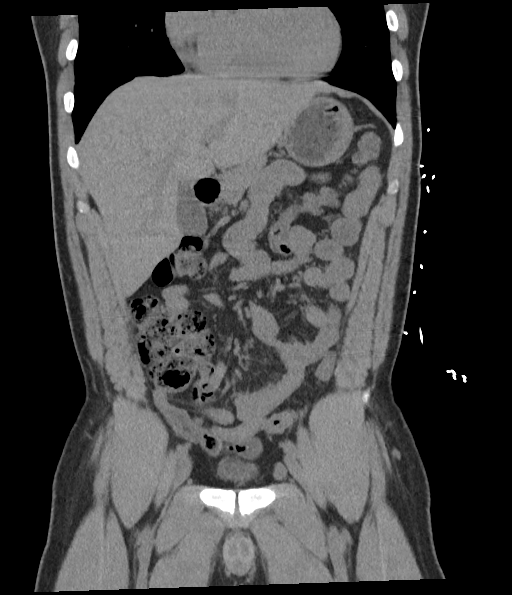
[im 60/134  soft-tissue]
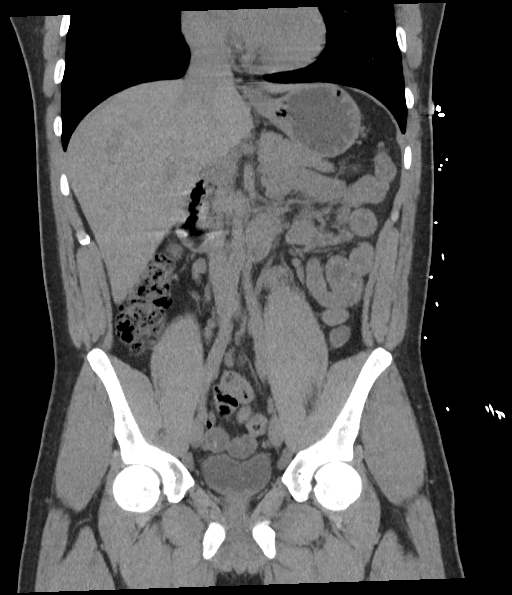
[im 74/134  soft-tissue]
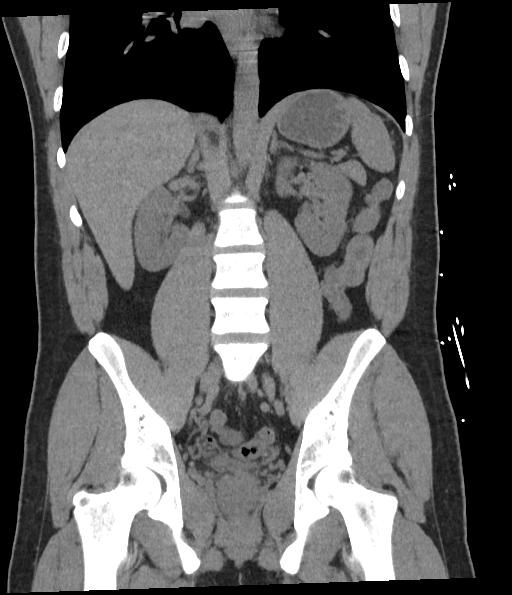

[16 of 46 positions shown; findings below may reference images not displayed]

FINDINGS: Lower chest: Lung bases clear

Hepatobiliary: Gallbladder and liver normal appearance

Pancreas: Normal appearance

Spleen: Normal appearance

Adrenals/Urinary Tract: Adrenal glands, LEFT kidney, and LEFT ureter
normal appearance. No RIGHT renal mass identified. Minimal
prominence of RIGHT renal collecting system. Nondistended RIGHT
ureter. Probable tiny distal RIGHT ureteral calculus image 73.
Bladder unremarkable.

Stomach/Bowel: Colon is under distended with suboptimal assessment
of wall thickness. Normal appendix. Stomach and bowel loops
otherwise normal appearance.

Vascular/Lymphatic: Aorta normal caliber. Numerous upper normal
sized to mildly enlarged inguinal lymph nodes bilaterally up to 14
mm diameter. No definite intra-abdominal or intrapelvic adenopathy.

Reproductive: Unremarkable prostate gland and seminal vesicles

Other: No free air or free fluid. No inflammatory process. 13 x 10
mm nodule at umbilicus, recommend correlation with physical exam.

Musculoskeletal: Unremarkable
IMPRESSION: Minimal prominence of RIGHT renal collecting system and ureter with
a probable tiny distal RIGHT ureteral calculus.

Numerous upper normal sized to mildly enlarged inguinal lymph nodes
bilaterally up to 14 mm diameter, of uncertain etiology; these could
be reactive but clinical follow-up until resolution recommended to
exclude other etiologies including lymphoma and tumor.

No other intra-abdominal or intrapelvic abnormalities.

13 x 10 mm nodule at umbilicus recommend correlation with physical
exam to exclude mass.

## 2022-03-21 ENCOUNTER — Ambulatory Visit: Payer: Self-pay

## 2022-04-05 ENCOUNTER — Encounter: Payer: Self-pay | Admitting: Nurse Practitioner

## 2022-04-05 ENCOUNTER — Ambulatory Visit: Payer: Self-pay | Admitting: Nurse Practitioner

## 2022-04-05 DIAGNOSIS — Z113 Encounter for screening for infections with a predominantly sexual mode of transmission: Secondary | ICD-10-CM

## 2022-04-05 DIAGNOSIS — A539 Syphilis, unspecified: Secondary | ICD-10-CM

## 2022-04-05 LAB — HM HEPATITIS C SCREENING LAB: HM Hepatitis Screen: NEGATIVE

## 2022-04-05 LAB — HM HIV SCREENING LAB: HM HIV Screening: NEGATIVE

## 2022-04-05 MED ORDER — PENICILLIN G BENZATHINE 1200000 UNIT/2ML IM SUSY
2.4000 10*6.[IU] | PREFILLED_SYRINGE | Freq: Once | INTRAMUSCULAR | Status: AC
  Administered 2022-04-05: 2.4 10*6.[IU] via INTRAMUSCULAR

## 2022-04-05 NOTE — Progress Notes (Signed)
[facility] Department STI clinic/screening visit  Subjective:  Cory Chan is a 31 y.o. male being seen today for an STI screening visit. The patient reports they do not have symptoms.    Patient has the following medical conditions:   Patient Active Problem List   Diagnosis Date Noted   Adjustment disorder with depressed mood      Chief Complaint  Patient presents with   SEXUALLY TRANSMITTED DISEASE    Patient states he is here for screening and treatment of syphilis    HPI  Patient reports to clinic today for STD screening.  Patient is currently asymptomatic but reports being a contact to Syphilis.    Does the patient or their partner desires a pregnancy in the next year? No  Screening for MPX risk: Does the patient have an unexplained rash? No Is the patient MSM? No Does the patient endorse multiple sex partners or anonymous sex partners? No Did the patient have close or sexual contact with a person diagnosed with MPX? No Has the patient traveled outside the [place] where MPX is endemic? No Is there a high clinical suspicion for MPX-- evidenced by one of the following No  -Unlikely to be chickenpox  -Lymphadenopathy  -Rash that present in same phase of evolution on any given body part   See flowsheet for further details and programmatic requirements.   Immunization History  Administered Date(s) Administered   Hepatitis B 04/01/2003, 05/20/2003   MMR 12/30/1991, 02/18/1996     The following portions of the patient's history were reviewed and updated as appropriate: allergies, current medications, past medical history, past social history, past surgical history and problem list.  Objective:  There were no vitals filed for this visit.  Physical Exam Constitutional:      Appearance: Normal appearance.  HENT:     Head: Normocephalic. No abrasion, masses or laceration. Hair is normal.     Right Ear: External ear normal.     Left Ear: External ear  normal.     Nose: Nose normal.     Mouth/Throat:     Lips: Pink.     Mouth: Mucous membranes are moist. No oral lesions.     Pharynx: No pharyngeal swelling, oropharyngeal exudate, posterior oropharyngeal erythema or uvula swelling.     Tonsils: No tonsillar exudate or tonsillar abscesses.     Comments: Poor dentition Eyes:     General: Lids are normal.        Right eye: No discharge.        Left eye: No discharge.     Conjunctiva/sclera: Conjunctivae normal.     Right eye: No exudate.    Left eye: No exudate. Abdominal:     General: Abdomen is flat.     Palpations: Abdomen is soft.     Tenderness: There is no abdominal tenderness. There is no rebound.  Genitourinary:    Pubic Area: No rash or pubic lice.      Penis: Normal and circumcised. No erythema or discharge.      Testes: Normal.        Right: Mass or tenderness not present.        Left: Mass or tenderness not present.     Rectum: Normal.     Comments: Discharge amount: none Color: none Musculoskeletal:     Cervical back: Full passive range of motion without pain, normal range of motion and neck supple.  Lymphadenopathy:     Cervical: No cervical adenopathy.  Right cervical: No superficial, deep or posterior cervical adenopathy.    Left cervical: No superficial, deep or posterior cervical adenopathy.     Upper Body:     Right upper body: No supraclavicular, axillary or epitrochlear adenopathy.     Left upper body: No supraclavicular, axillary or epitrochlear adenopathy.     Lower Body: No right inguinal adenopathy. No left inguinal adenopathy.  Skin:    General: Skin is warm and dry.     Findings: No lesion or rash.  Neurological:     Mental Status: He is alert and oriented to person, place, and time.  Psychiatric:        Attention and Perception: Attention normal.        Mood and Affect: Mood normal.        Speech: Speech normal.        Behavior: Behavior normal. Behavior is cooperative.        Assessment and Plan:  Cory Chan is a 31 y.o. male presenting to the [facility] Department for STI screening  1. Screening examination for venereal disease -31 year old male in clinic today for STD screening and to receive treatment as a contact to Syphilis.  -Patient does not have STI symptoms Patient accepted all screenings including urine CT/GC and bloodwork for HIV/RPR.  Patient meets criteria for HepB screening? Yes. Ordered? Yes Patient meets criteria for HepC screening? Yes. Ordered? Yes Recommended condom use with all sex Discussed importance of condom use for STI prevent  Discussed time line for State Lab results and that patient will be called with positive results and encouraged patient to call if he had not heard in 2 weeks Recommended returning for continued or worsening symptoms.    - HIV/HCV [facility] - Syphilis Serology, [place] [facility] - HBV Antigen/Antibody [facility] - Chlamydia/GC NAA, Confirmation  2. Syphilis -Treat patient today as a contact to Syphilis.  Advised patient no sex for 14 days.    - penicillin g benzathine (BICILLIN LA) 1200000 UNIT/2ML injection 2.4 Million Units   Total time spent: 30 minutes   Return if symptoms worsen or fail to improve.    Gregary Cromer, FNP

## 2022-04-08 LAB — CHLAMYDIA/GC NAA, CONFIRMATION
Chlamydia trachomatis, NAA: NEGATIVE
Neisseria gonorrhoeae, NAA: NEGATIVE

## 2022-04-16 ENCOUNTER — Telehealth: Payer: Self-pay

## 2022-04-16 NOTE — Telephone Encounter (Signed)
Calling pt re syphilis result from 04/05/22 specimen: RPR= Reactive Titer= 1:16 TP= Reactive  Pt treated as contact to syphilis on 04/05/22 (Bicillin 24 MU x1). See provider written order on TR: pt to follow-up in 6 months for additional testing.  Tx completed.  Phone call to pt at [phone]. Pt confirmed password. Counseled pt re results/titer/tx and requested he follow-up in 6 months. Pt expressed understanding.

## 2022-05-21 ENCOUNTER — Emergency Department
Admission: EM | Admit: 2022-05-21 | Discharge: 2022-05-21 | Disposition: A | Discharge status: Home / Self Care | Payer: Self-pay | Attending: Emergency Medicine | Admitting: Emergency Medicine

## 2022-05-21 ENCOUNTER — Other Ambulatory Visit: Payer: Self-pay

## 2022-05-21 ENCOUNTER — Emergency Department: Payer: Self-pay

## 2022-05-21 ENCOUNTER — Encounter: Payer: Self-pay | Admitting: Emergency Medicine

## 2022-05-21 DIAGNOSIS — Z8616 Personal history of COVID-19: Secondary | ICD-10-CM | POA: Insufficient documentation

## 2022-05-21 DIAGNOSIS — Z1152 Encounter for screening for COVID-19: Secondary | ICD-10-CM | POA: Insufficient documentation

## 2022-05-21 DIAGNOSIS — K529 Noninfective gastroenteritis and colitis, unspecified: Secondary | ICD-10-CM | POA: Insufficient documentation

## 2022-05-21 LAB — RESP PANEL BY RT-PCR (FLU A&B, COVID) ARPGX2
Influenza A by PCR: NEGATIVE
Influenza B by PCR: NEGATIVE
SARS Coronavirus 2 by RT PCR: NEGATIVE

## 2022-05-21 LAB — COMPREHENSIVE METABOLIC PANEL
ALT: 31 U/L (ref 0–44)
AST: 26 U/L (ref 15–41)
Albumin: 4 g/dL (ref 3.5–5.0)
Alkaline Phosphatase: 111 U/L (ref 38–126)
Anion gap: 9 (ref 5–15)
BUN: 8 mg/dL (ref 6–20)
CO2: 20 mmol/L — ABNORMAL LOW (ref 22–32)
Calcium: 9 mg/dL (ref 8.9–10.3)
Chloride: 107 mmol/L (ref 98–111)
Creatinine, Ser: 0.82 mg/dL (ref 0.61–1.24)
GFR, Estimated: >60 mL/min (ref >60)
Glucose, Bld: 90 mg/dL (ref 70–99)
Potassium: 4.5 mmol/L (ref 3.5–5.1)
Sodium: 136 mmol/L (ref 135–145)
Total Bilirubin: 1.2 mg/dL (ref 0.3–1.2)
Total Protein: 7.7 g/dL (ref 6.5–8.1)

## 2022-05-21 LAB — CBC
HCT: 49.8 % (ref 39.0–52.0)
Hemoglobin: 15.9 g/dL (ref 13.0–17.0)
MCH: 26 pg (ref 26.0–34.0)
MCHC: 31.9 g/dL (ref 30.0–36.0)
MCV: 81.4 fL (ref 80.0–100.0)
Platelets: 176 10*3/uL (ref 150–400)
RBC: 6.12 MIL/uL — ABNORMAL HIGH (ref 4.22–5.81)
RDW: 14.2 % (ref 11.5–15.5)
WBC: 11.9 10*3/uL — ABNORMAL HIGH (ref 4.0–10.5)
nRBC: 0 % (ref 0.0–0.2)

## 2022-05-21 LAB — URINALYSIS, ROUTINE W REFLEX MICROSCOPIC
Bilirubin Urine: NEGATIVE
Glucose, UA: NEGATIVE mg/dL
Hgb urine dipstick: NEGATIVE
Ketones, ur: 5 mg/dL — AB
Leukocytes,Ua: NEGATIVE
Nitrite: NEGATIVE
Protein, ur: NEGATIVE mg/dL
Specific Gravity, Urine: 1.043 — ABNORMAL HIGH (ref 1.005–1.030)
pH: 6 (ref 5.0–8.0)

## 2022-05-21 LAB — TROPONIN I (HIGH SENSITIVITY): Troponin I (High Sensitivity): 7 ng/L (ref <18)

## 2022-05-21 LAB — LIPASE, BLOOD: Lipase: 28 U/L (ref 11–51)

## 2022-05-21 MED ORDER — KETOROLAC TROMETHAMINE 15 MG/ML IJ SOLN: 15.0000 mg | Freq: Once | INTRAMUSCULAR | Status: DC

## 2022-05-21 MED ORDER — ACETAMINOPHEN 500 MG PO TABS: 1000.0000 mg | ORAL_TABLET | Freq: Once | ORAL | Status: DC

## 2022-05-21 MED ORDER — FENTANYL CITRATE PF 50 MCG/ML IJ SOSY
75.0000 ug | PREFILLED_SYRINGE | Freq: Once | INTRAMUSCULAR | Status: AC
  Administered 2022-05-21: 75 ug via INTRAVENOUS
  Filled 2022-05-21: qty 2

## 2022-05-21 MED ORDER — IOHEXOL 300 MG/ML  SOLN
100.0000 mL | Freq: Once | INTRAMUSCULAR | Status: AC | PRN
  Administered 2022-05-21: 100 mL via INTRAVENOUS

## 2022-05-21 MED ORDER — SODIUM CHLORIDE 0.9 % IV BOLUS
1000.0000 mL | Freq: Once | INTRAVENOUS | Status: AC
  Administered 2022-05-21: 1000 mL via INTRAVENOUS

## 2022-05-21 MED ORDER — KETOROLAC TROMETHAMINE 15 MG/ML IJ SOLN
15.0000 mg | Freq: Once | INTRAMUSCULAR | Status: AC
  Administered 2022-05-21: 15 mg via INTRAVENOUS
  Filled 2022-05-21: qty 1

## 2022-05-21 MED ORDER — ONDANSETRON HCL 4 MG/2ML IJ SOLN
4.0000 mg | Freq: Once | INTRAMUSCULAR | Status: AC
  Administered 2022-05-21: 4 mg via INTRAVENOUS
  Filled 2022-05-21: qty 2

## 2022-05-21 MED ORDER — ONDANSETRON 4 MG PO TBDP: 4.0000 mg | ORAL_TABLET | Freq: Three times a day (TID) | ORAL | 0 refills | Status: AC | PRN

## 2022-05-21 NOTE — ED Triage Notes (Signed)
First Nurse, EMS REPORT  Pt coming in for 8/10 abdominal pain and nausea.   [id number]

## 2022-05-21 NOTE — ED Triage Notes (Signed)
Patient reports waking up last night coughing then having abdominal pain and diarrhea.

## 2022-05-21 NOTE — ED Provider Notes (Signed)
[facility] Provider Note    Event Date/Time   First MD Initiated Contact with Patient 05/21/22 1303     (approximate)   History   Abdominal Pain   HPI  Cory Chan is a 31 y.o. male with history of kidney stones who comes in with concerns for nausea vomiting diarrhea and abdominal discomfort.  Patient reports symptoms started overnight.  He reports multiple episodes of nausea, vomiting that is nonbloody nonbilious as well as some brown loose stool 4-5 episodes of diarrhea.  Denies any blood in it.  He denies any prior history of abdominal surgeries but he does report some abdominal discomfort as well.  He denies having COVID or flu recently.  Denies any trouble with [place], being around elderly people, recent antibiotics, drinking like water   Physical Exam   Triage Vital Signs: ED Triage Vitals [05/21/22 1240]  Enc Vitals Group     BP 124/79     Pulse Rate 70     Resp 16     Temp 98.2 F (36.8 C)     Temp Source Oral     SpO2 98 %     Weight 185 lb (83.9 kg)     Height 5\' 9"  (1.753 m)     Head Circumference      Peak Flow      Pain Score 7     Pain Loc      Pain Edu?      Excl. in GC?     Most recent vital signs: Vitals:   05/21/22 1240  BP: 124/79  Pulse: 70  Resp: 16  Temp: 98.2 F (36.8 C)  SpO2: 98%     General: Awake, no distress.  CV:  Good peripheral perfusion.  Resp:  Normal effort.  Abd:  No distention.  Other:  Patient is tearful he is got tenderness throughout his abdomen.   ED Results / Procedures / Treatments   Labs (all labs ordered are listed, but only abnormal results are displayed) Labs Reviewed  LIPASE, BLOOD  COMPREHENSIVE METABOLIC PANEL  CBC  URINALYSIS, ROUTINE W REFLEX MICROSCOPIC     EKG  My interpretation of EKG:  Normal sinus rate of 77 with an incomplete right bundle branch block, T wave version lead III, some ST sloping in V2  RADIOLOGY I have reviewed the CT personally and  interpretted no appendicitis    PROCEDURES:  Critical Care performed: No  Procedures   MEDICATIONS ORDERED IN ED: Medications  sodium chloride 0.9 % bolus 1,000 mL (has no administration in time range)  ondansetron (ZOFRAN) injection 4 mg (has no administration in time range)  acetaminophen (TYLENOL) tablet 1,000 mg (has no administration in time range)  ketorolac (TORADOL) 15 MG/ML injection 15 mg (has no administration in time range)     IMPRESSION / MDM / ASSESSMENT AND PLAN / ED COURSE  I reviewed the triage vital signs and the nursing notes.   Patient's presentation is most consistent with acute presentation with potential threat to life or bodily function.   Patient comes in with abdominal discomfort nausea vomiting diarrhea septic most likely gastroenteritis the patient is very tearful crawling up and pain and I did review a prior CT scan where he has enlarged lymph nodes and denies having this ever followed up therefore I think patient should get another CT scans to evaluate these lymph nodes make sure there is no evidence of lymphoma, obstruction or other acute pathology.  No indication  for stool testing given otherwise young and healthy without any risk factors for C. difficile   White count slightly elevated at 11.9.  Troponin negative.  Lipase normal CMP normal    Multiple low densities are noted in the a patent parenchyma, the  largest measuring 2.1 cm the right hepatic lobe. These may simply  represent hemangiomas, but other pathology such as neoplasm cannot  be excluded. When the patient is clinically stable and able to  follow directions and hold their breath (preferably as an  outpatient) further evaluation with dedicated abdominal MRI should  be considered.    No other abnormality seen in the abdomen or pelvis.    2:46 PM on reassessment patient reports feeling much better reports pain is now down to a 3 out of 10.  He is open to some Toradol declines  anything for nausea.  Patient is able to give a urine sample.  I discussed the CT scan and he expressed understanding.  Will follow-up outpatient for MRI to rule out any type of cancer.  Provided him a copy of report.  However I suspect this is most likely gastroenteritis and he is happy that the lymph nodes from a year ago have now since resolved.  Patient be discharged after urine, p.o. challenge   The patient is on the cardiac monitor to evaluate for evidence of arrhythmia and/or significant heart rate changes.      FINAL CLINICAL IMPRESSION(S) / ED DIAGNOSES   Final diagnoses:  Gastroenteritis     Rx / DC Orders   ED Discharge Orders     None        Note:  This document was prepared using Dragon voice recognition software and may include unintentional dictation errors.   Vanessa Mitiwanga, MD 05/21/22 1447

## 2022-05-21 NOTE — Discharge Instructions (Addendum)
Your workup was reassuring but you will need to follow-up for your CT scan as we discussed-  I suspect this is most likely from a virus causing nausea vomiting and diarrhea.  I prescribed Zofran.  Drink plenty of fluids including Gatorade light or Pedialyte return to the ER if develop fevers worsening symptoms or any other concerns  IMPRESSION: Multiple low densities are noted in the a patent parenchyma, the largest measuring 2.1 cm the right hepatic lobe. These may simply represent hemangiomas, but other pathology such as neoplasm cannot be excluded. When the patient is clinically stable and able to follow directions and hold their breath (preferably as an outpatient) further evaluation with dedicated abdominal MRI should be considered.   No other abnormality seen in the abdomen or pelvis.

## 2022-06-20 ENCOUNTER — Emergency Department
Admission: EM | Admit: 2022-06-20 | Discharge: 2022-06-20 | Discharge status: Against Medical Advice | Payer: Self-pay | Attending: Emergency Medicine | Admitting: Emergency Medicine

## 2022-06-20 DIAGNOSIS — R079 Chest pain, unspecified: Secondary | ICD-10-CM | POA: Insufficient documentation

## 2022-06-20 DIAGNOSIS — M7918 Myalgia, other site: Secondary | ICD-10-CM | POA: Insufficient documentation

## 2022-06-20 DIAGNOSIS — R0602 Shortness of breath: Secondary | ICD-10-CM | POA: Insufficient documentation

## 2022-06-20 DIAGNOSIS — Z5321 Procedure and treatment not carried out due to patient leaving prior to being seen by health care provider: Secondary | ICD-10-CM | POA: Insufficient documentation

## 2022-06-20 NOTE — ED Triage Notes (Signed)
Ems report: ems from home for body aches, SOB, CP that stared last night. VS WNL except temp 99.7

## 2022-06-21 ENCOUNTER — Other Ambulatory Visit: Payer: Self-pay

## 2022-06-21 ENCOUNTER — Emergency Department
Admission: EM | Admit: 2022-06-21 | Discharge: 2022-06-21 | Disposition: A | Discharge status: Home / Self Care | Payer: Self-pay | Attending: Emergency Medicine | Admitting: Emergency Medicine

## 2022-06-21 DIAGNOSIS — J111 Influenza due to unidentified influenza virus with other respiratory manifestations: Secondary | ICD-10-CM

## 2022-06-21 DIAGNOSIS — Z20822 Contact with and (suspected) exposure to covid-19: Secondary | ICD-10-CM | POA: Insufficient documentation

## 2022-06-21 DIAGNOSIS — J101 Influenza due to other identified influenza virus with other respiratory manifestations: Secondary | ICD-10-CM | POA: Insufficient documentation

## 2022-06-21 LAB — RESP PANEL BY RT-PCR (RSV, FLU A&B, COVID)  RVPGX2
Influenza A by PCR: POSITIVE — AB
Influenza B by PCR: NEGATIVE
Resp Syncytial Virus by PCR: NEGATIVE
SARS Coronavirus 2 by RT PCR: NEGATIVE

## 2022-06-21 MED ORDER — KETOROLAC TROMETHAMINE 30 MG/ML IJ SOLN
30.0000 mg | Freq: Once | INTRAMUSCULAR | Status: AC
  Administered 2022-06-21: 30 mg via INTRAMUSCULAR
  Filled 2022-06-21: qty 1

## 2022-06-21 NOTE — ED Provider Notes (Signed)
[facility] Provider Note    Event Date/Time   First MD Initiated Contact with Patient 06/21/22 [phone]     (approximate)   History   Chief Complaint Generalized Body Aches   HPI  Cory Chan is a 31 y.o. male with no significant past medical history who presents to the ED complaining of bodyaches.  Patient reports that for the past 4 days he has been dealing with aches and pains throughout his entire body.  He reports subjective fevers and chills along with malaise, cough, congestion, and nausea.  He is not aware of any sick contacts.  He denies any chest pain, shortness of breath, abdominal pain, or dysuria.  He has not taken anything for his symptoms prior to arrival.     Physical Exam   Triage Vital Signs: ED Triage Vitals [06/21/22 0815]  Enc Vitals Group     BP 129/84     Pulse Rate 96     Resp 20     Temp 100 F (37.8 C)     Temp Source Oral     SpO2 98 %     Weight 185 lb (83.9 kg)     Height 5\' 9"  (1.753 m)     Head Circumference      Peak Flow      Pain Score 10     Pain Loc      Pain Edu?      Excl. in [place]?     Most recent vital signs: Vitals:   06/21/22 0815  BP: 129/84  Pulse: 96  Resp: 20  Temp: 100 F (37.8 C)  SpO2: 98%    Constitutional: Alert and oriented. Eyes: Conjunctivae are normal. Head: Atraumatic. Nose: No congestion/rhinnorhea. Mouth/Throat: Mucous membranes are moist.  Cardiovascular: Normal rate, regular rhythm. Grossly normal heart sounds.  2+ radial pulses bilaterally. Respiratory: Normal respiratory effort.  No retractions. Lungs CTAB. Gastrointestinal: Soft and nontender. No distention. Musculoskeletal: No lower extremity tenderness nor edema.  Neurologic:  Normal speech and language. No gross focal neurologic deficits are appreciated.    ED Results / Procedures / Treatments   Labs (all labs ordered are listed, but only abnormal results are displayed) Labs Reviewed  RESP PANEL BY RT-PCR  (RSV, FLU A&B, COVID)  RVPGX2 - Abnormal; Notable for the following components:      Result Value   Influenza A by PCR POSITIVE (*)    All other components within normal limits    PROCEDURES:  Critical Care performed: No  Procedures   MEDICATIONS ORDERED IN ED: Medications  ketorolac (TORADOL) 30 MG/ML injection 30 mg (has no administration in time range)     IMPRESSION / MDM / ASSESSMENT AND PLAN / ED COURSE  I reviewed the triage vital signs and the nursing notes.                              31 y.o. male with no significant past medical history who presents to the ED complaining of 4 days of diffuse bodyaches, cough, congestion, malaise, and nausea.  Patient's presentation is most consistent with acute illness / injury with system symptoms.  Differential diagnosis includes, but is not limited to, influenza, COVID-19, other viral syndrome, dehydration, electrolyte abnormality, anemia.  Patient nontoxic-appearing and in no acute distress, vital signs are unremarkable.  He is not in any respiratory distress and maintaining oxygen saturations on room air.  He has a benign  abdominal exam.  Symptoms consistent with viral illness and patient has tested positive for influenza.  We will treat symptomatically with IM Toradol but he is appropriate for outpatient management, was counseled to continue Tylenol and ibuprofen as needed.  He was counseled to return to the ED for new or worsening symptoms, patient agrees with plan.      FINAL CLINICAL IMPRESSION(S) / ED DIAGNOSES   Final diagnoses:  Influenza     Rx / DC Orders   ED Discharge Orders     None        Note:  This document was prepared using Dragon voice recognition software and may include unintentional dictation errors.   Chesley Noon, MD 06/21/22 [phone]

## 2022-06-21 NOTE — ED Notes (Signed)
See triage note  Presents with body aches and low grade temp  States he has been sick for couple of days

## 2022-06-21 NOTE — ED Triage Notes (Signed)
Reports generalized bodyaches.  Came yesterday but left AMA.  Patient reports symptoms have been ongoing for 3-4days.

## 2022-06-23 ENCOUNTER — Other Ambulatory Visit: Payer: Self-pay

## 2022-06-23 ENCOUNTER — Emergency Department: Payer: Self-pay

## 2022-06-23 DIAGNOSIS — R1011 Right upper quadrant pain: Secondary | ICD-10-CM | POA: Insufficient documentation

## 2022-06-23 DIAGNOSIS — Z5321 Procedure and treatment not carried out due to patient leaving prior to being seen by health care provider: Secondary | ICD-10-CM | POA: Insufficient documentation

## 2022-06-23 LAB — COMPREHENSIVE METABOLIC PANEL
ALT: 32 U/L (ref 0–44)
AST: 33 U/L (ref 15–41)
Albumin: 4.1 g/dL (ref 3.5–5.0)
Alkaline Phosphatase: 90 U/L (ref 38–126)
Anion gap: 11 (ref 5–15)
BUN: 15 mg/dL (ref 6–20)
CO2: 21 mmol/L — ABNORMAL LOW (ref 22–32)
Calcium: 8.6 mg/dL — ABNORMAL LOW (ref 8.9–10.3)
Chloride: 104 mmol/L (ref 98–111)
Creatinine, Ser: 1.04 mg/dL (ref 0.61–1.24)
GFR, Estimated: >60 mL/min (ref >60)
Glucose, Bld: 106 mg/dL — ABNORMAL HIGH (ref 70–99)
Potassium: 3.7 mmol/L (ref 3.5–5.1)
Sodium: 136 mmol/L (ref 135–145)
Total Bilirubin: 1.1 mg/dL (ref 0.3–1.2)
Total Protein: 7.9 g/dL (ref 6.5–8.1)

## 2022-06-23 LAB — CBC
HCT: 51.9 % (ref 39.0–52.0)
Hemoglobin: 16.7 g/dL (ref 13.0–17.0)
MCH: 25.8 pg — ABNORMAL LOW (ref 26.0–34.0)
MCHC: 32.2 g/dL (ref 30.0–36.0)
MCV: 80.2 fL (ref 80.0–100.0)
Platelets: 190 10*3/uL (ref 150–400)
RBC: 6.47 MIL/uL — ABNORMAL HIGH (ref 4.22–5.81)
RDW: 13.8 % (ref 11.5–15.5)
WBC: 4 10*3/uL (ref 4.0–10.5)
nRBC: 0 % (ref 0.0–0.2)

## 2022-06-23 LAB — LIPASE, BLOOD: Lipase: 35 U/L (ref 11–51)

## 2022-06-23 MED ORDER — ONDANSETRON 4 MG PO TBDP
4.0000 mg | ORAL_TABLET | Freq: Once | ORAL | Status: AC | PRN
  Administered 2022-06-23: 4 mg via ORAL
  Filled 2022-06-23: qty 1

## 2022-06-23 NOTE — ED Provider Triage Note (Signed)
Emergency Medicine Provider Triage Evaluation Note  Cory Chan , a 31 y.o. male  was evaluated in triage.  Pt complains of upper abdominal pain, history of gallstones, vomiting.  Review of Systems  Positive:  Negative:   Physical Exam  There were no vitals taken for this visit. Gen:   Awake, no distress   Resp:  Normal effort  MSK:   Moves extremities without difficulty  Other:  Abdomen tender right upper quadrant  Medical Decision Making  Medically screening exam initiated at 9:28 PM.  Appropriate orders placed.  Trevon Strothers was informed that the remainder of the evaluation will be completed by another provider, this initial triage assessment does not replace that evaluation, and the importance of remaining in the ED until their evaluation is complete.     Faythe Ghee, PA-C 06/23/22 2129

## 2022-06-23 NOTE — ED Triage Notes (Signed)
Pt via POV c/o mid abdominal pain ongoing for 4 days. Pain associated with NVD and decreased PO intake. Pt states pain similar to gallstones in the past. No abdominal surgeries.

## 2022-06-24 ENCOUNTER — Emergency Department
Admission: EM | Admit: 2022-06-24 | Discharge: 2022-06-24 | Discharge status: Against Medical Advice | Payer: Self-pay | Attending: Emergency Medicine | Admitting: Emergency Medicine

## 2022-06-24 NOTE — ED Notes (Signed)
Pt to desk inquiring about results and wait times. Advise pt nurse is unable to go over results in triage, and also unable to determine specific wait times. Pt walks away mumbling obscenities directed towards staff

## 2022-07-02 ENCOUNTER — Telehealth: Payer: Self-pay | Admitting: Emergency Medicine

## 2022-07-02 NOTE — Telephone Encounter (Addendum)
Called patient to inform of std results.  Says call will not go through.  Will send letter.  There are recommendation on ultrasound report for follow up testing.  No pcp listed.

## 2022-09-24 ENCOUNTER — Emergency Department: Payer: Self-pay

## 2022-09-24 ENCOUNTER — Other Ambulatory Visit: Payer: Self-pay

## 2022-09-24 ENCOUNTER — Emergency Department
Admission: EM | Admit: 2022-09-24 | Discharge: 2022-09-24 | Disposition: A | Discharge status: Home / Self Care | Payer: Self-pay | Attending: Emergency Medicine | Admitting: Emergency Medicine

## 2022-09-24 DIAGNOSIS — R1084 Generalized abdominal pain: Secondary | ICD-10-CM | POA: Insufficient documentation

## 2022-09-24 DIAGNOSIS — R519 Headache, unspecified: Secondary | ICD-10-CM | POA: Insufficient documentation

## 2022-09-24 DIAGNOSIS — F17290 Nicotine dependence, other tobacco product, uncomplicated: Secondary | ICD-10-CM | POA: Insufficient documentation

## 2022-09-24 DIAGNOSIS — K7689 Other specified diseases of liver: Secondary | ICD-10-CM | POA: Insufficient documentation

## 2022-09-24 LAB — CBC WITH DIFFERENTIAL/PLATELET
Abs Immature Granulocytes: 0.03 10*3/uL (ref 0.00–0.07)
Basophils Absolute: 0 10*3/uL (ref 0.0–0.1)
Basophils Relative: 0 %
Eosinophils Absolute: 0 10*3/uL (ref 0.0–0.5)
Eosinophils Relative: 0 %
HCT: 44.7 % (ref 39.0–52.0)
Hemoglobin: 14.4 g/dL (ref 13.0–17.0)
Immature Granulocytes: 0 %
Lymphocytes Relative: 33 %
Lymphs Abs: 2.7 10*3/uL (ref 0.7–4.0)
MCH: 26.3 pg (ref 26.0–34.0)
MCHC: 32.2 g/dL (ref 30.0–36.0)
MCV: 81.7 fL (ref 80.0–100.0)
Monocytes Absolute: 0.5 10*3/uL (ref 0.1–1.0)
Monocytes Relative: 6 %
Neutro Abs: 5 10*3/uL (ref 1.7–7.7)
Neutrophils Relative %: 61 %
Platelets: 286 10*3/uL (ref 150–400)
RBC: 5.47 MIL/uL (ref 4.22–5.81)
RDW: 14.2 % (ref 11.5–15.5)
WBC: 8.3 10*3/uL (ref 4.0–10.5)
nRBC: 0 % (ref 0.0–0.2)

## 2022-09-24 LAB — COMPREHENSIVE METABOLIC PANEL
ALT: 18 U/L (ref 0–44)
AST: 19 U/L (ref 15–41)
Albumin: 4 g/dL (ref 3.5–5.0)
Alkaline Phosphatase: 80 U/L (ref 38–126)
Anion gap: 12 (ref 5–15)
BUN: 10 mg/dL (ref 6–20)
CO2: 23 mmol/L (ref 22–32)
Calcium: 8.8 mg/dL — ABNORMAL LOW (ref 8.9–10.3)
Chloride: 104 mmol/L (ref 98–111)
Creatinine, Ser: 0.96 mg/dL (ref 0.61–1.24)
GFR, Estimated: >60 mL/min (ref >60)
Glucose, Bld: 98 mg/dL (ref 70–99)
Potassium: 3.4 mmol/L — ABNORMAL LOW (ref 3.5–5.1)
Sodium: 139 mmol/L (ref 135–145)
Total Bilirubin: 0.8 mg/dL (ref 0.3–1.2)
Total Protein: 7.4 g/dL (ref 6.5–8.1)

## 2022-09-24 LAB — LIPASE, BLOOD: Lipase: 25 U/L (ref 11–51)

## 2022-09-24 LAB — TROPONIN I (HIGH SENSITIVITY): Troponin I (High Sensitivity): 4 ng/L (ref <18)

## 2022-09-24 MED ORDER — KETOROLAC TROMETHAMINE 15 MG/ML IJ SOLN
15.0000 mg | Freq: Once | INTRAMUSCULAR | Status: AC
  Administered 2022-09-24: 15 mg via INTRAVENOUS
  Filled 2022-09-24: qty 1

## 2022-09-24 MED ORDER — IOHEXOL 300 MG/ML  SOLN
100.0000 mL | Freq: Once | INTRAMUSCULAR | Status: AC | PRN
  Administered 2022-09-24: 100 mL via INTRAVENOUS

## 2022-09-24 MED ORDER — METOCLOPRAMIDE HCL 5 MG/ML IJ SOLN
10.0000 mg | Freq: Once | INTRAMUSCULAR | Status: AC
  Administered 2022-09-24: 10 mg via INTRAVENOUS
  Filled 2022-09-24: qty 2

## 2022-09-24 MED ORDER — SODIUM CHLORIDE 0.9 % IV BOLUS
1000.0000 mL | Freq: Once | INTRAVENOUS | Status: AC
  Administered 2022-09-24: 1000 mL via INTRAVENOUS

## 2022-09-24 NOTE — ED Provider Notes (Signed)
[facility] Provider Note    Event Date/Time   First MD Initiated Contact with Patient 09/24/22 1459     (approximate)   History   Abdominal Pain and Headache   HPI  Cory Chan is a 32 y.o. male no significant past medical history who presents to the emergency department with abdominal pain and headache.  States that around 1 PM developed a severe headache and abdominal pain.  Associated with nausea and vomiting.  Similar headache in the past but he is uncertain of what the cause was.  Denies any falls or trauma.  No change in vision, slurring of speech, extremity numbness or weakness.  Complaining of pain across his abdomen that is worse on the right side and then radiates downward into his "butt".  Endorses daily marijuana use and smokes Black and milds.  Denies any significant alcohol use.  Denies any dysuria, urinary urgency or frequency.  No history of kidney stones.    Physical Exam   Triage Vital Signs: ED Triage Vitals  Enc Vitals Group     BP      Pulse      Resp      Temp      Temp src      SpO2      Weight      Height      Head Circumference      Peak Flow      Pain Score      Pain Loc      Pain Edu?      Excl. in [place]?     Most recent vital signs: Vitals:   09/24/22 1502 09/24/22 1506  BP:  (!) 152/91  Pulse: (!) 59   Resp: 18   Temp: 98 F (36.7 C)   SpO2: 99%     Physical Exam Constitutional:      General: He is in acute distress.     Appearance: He is well-developed.  HENT:     Head: Atraumatic.  Eyes:     Conjunctiva/sclera: Conjunctivae normal.     Pupils: Pupils are equal, round, and reactive to light.  Cardiovascular:     Rate and Rhythm: Regular rhythm.  Pulmonary:     Effort: No respiratory distress.  Abdominal:     Tenderness: There is abdominal tenderness. There is right CVA tenderness.     Comments: Right lower quadrant abdominal tenderness to palpation.  Right upper quadrant abdominal tenderness.   Negative Murphy sign.  Musculoskeletal:        General: Normal range of motion.     Cervical back: Normal range of motion.     Right lower leg: No edema.     Left lower leg: No edema.  Skin:    General: Skin is warm.     Capillary Refill: Capillary refill takes less than 2 seconds.  Neurological:     Mental Status: He is alert. Mental status is at baseline.     IMPRESSION / MDM / ASSESSMENT AND PLAN / ED COURSE  I reviewed the triage vital signs and the nursing notes.  Differential diagnosis including appendicitis, migraine headache, kidney stone, subarachnoid hemorrhage, cholelithiasis, kidney stone   EKG  I, Nathaniel Man, the attending physician, personally viewed and interpreted this ECG.   Rate: Normal  Rhythm: Normal sinus  Axis: Normal  Intervals: Normal  ST&T Change: None  No tachycardic or bradycardic dysrhythmias while on cardiac telemetry.  RADIOLOGY I independently reviewed imaging, my interpretation  of imaging: CT head, CT abdomen and pelvis with contrast liver cyst, no signs of acute appendicitis.  CT scan of the head with no acute findings.  LABS (all labs ordered are listed, but only abnormal results are displayed) Labs interpreted as -    Labs Reviewed  COMPREHENSIVE METABOLIC PANEL - Abnormal; Notable for the following components:      Result Value   Potassium 3.4 (*)    Calcium 8.8 (*)    All other components within normal limits  LIPASE, BLOOD  CBC WITH DIFFERENTIAL/PLATELET  URINALYSIS, ROUTINE W REFLEX MICROSCOPIC  TROPONIN I (HIGH SENSITIVITY)    TREATMENT  IV fluids, IV Reglan, IV ketorolac  MDM  On reevaluation patient states that he is feeling much better.  Repeat abdominal exam is nontender to palpation.  No right upper quadrant abdominal tenderness to palpation and no rebound or guarding.  CT scan of the head shows no signs of intracranial hemorrhage, clinical picture is not consistent with subarachnoid hemorrhage.  CT scan of  the liver noted liver cyst have been unchanged and recommended MRI with contrast to further evaluate.  Patient does not have any abdominal tenderness at this time and given that his CT scan has been unchanged, patient states that he has been told in the past to follow-up as an outpatient.  He has not yet called to establish care.  Again discussed these findings with the patient, given information and of referral to primary care provider.  Given return precautions for any worsening symptoms.  Possibly migraine headache versus cyclical vomiting syndrome.     PROCEDURES:  Critical Care performed: No  Procedures  Patient's presentation is most consistent with acute presentation with potential threat to life or bodily function.   MEDICATIONS ORDERED IN ED: Medications  sodium chloride 0.9 % bolus 1,000 mL (1,000 mLs Intravenous New Bag/Given 09/24/22 1520)  metoCLOPramide (REGLAN) injection 10 mg (10 mg Intravenous Given 09/24/22 1521)  ketorolac (TORADOL) 15 MG/ML injection 15 mg (15 mg Intravenous Given 09/24/22 1521)  iohexol (OMNIPAQUE) 300 MG/ML solution 100 mL (100 mLs Intravenous Contrast Given 09/24/22 1555)    FINAL CLINICAL IMPRESSION(S) / ED DIAGNOSES   Final diagnoses:  Hepatic cyst  Generalized abdominal pain     Rx / DC Orders   ED Discharge Orders          Ordered    Ambulatory Referral to Primary Care (Establish Care)        09/24/22 1708             Note:  This document was prepared using Dragon voice recognition software and may include unintentional dictation errors.   Nathaniel Man, MD 09/24/22 1710

## 2022-09-24 NOTE — Discharge Instructions (Addendum)
You are seen in the emergency department following an episode of headache with abdominal pain and vomiting.  Your lab work was normal.  You are found to have a liver cyst and recommended that you have an MRI with contrast of your liver.  Call primary care doctor to help schedule this.  Return to the emergency department for any recurrence or worsening symptoms.

## 2022-09-24 NOTE — ED Triage Notes (Signed)
Pt arrives from home via EMS w/ c/o generalized abd pain and headache. Pt denies sick contacts.  VSS

## 2022-10-10 ENCOUNTER — Telehealth: Payer: Self-pay

## 2022-10-10 NOTE — Telephone Encounter (Signed)
Received referral from ED. Left vmail.  

## 2023-01-24 ENCOUNTER — Encounter: Payer: Self-pay | Admitting: Emergency Medicine

## 2023-01-24 ENCOUNTER — Other Ambulatory Visit: Payer: Self-pay

## 2023-01-24 ENCOUNTER — Emergency Department
Admission: EM | Admit: 2023-01-24 | Discharge: 2023-01-24 | Disposition: A | Discharge status: Home / Self Care | Payer: Self-pay | Attending: Student in an Organized Health Care Education/Training Program | Admitting: Student in an Organized Health Care Education/Training Program

## 2023-01-24 ENCOUNTER — Emergency Department: Payer: Self-pay

## 2023-01-24 DIAGNOSIS — B349 Viral infection, unspecified: Secondary | ICD-10-CM | POA: Insufficient documentation

## 2023-01-24 DIAGNOSIS — Z1152 Encounter for screening for COVID-19: Secondary | ICD-10-CM | POA: Insufficient documentation

## 2023-01-24 LAB — GROUP A STREP BY PCR: Group A Strep by PCR: NOT DETECTED

## 2023-01-24 LAB — RESP PANEL BY RT-PCR (RSV, FLU A&B, COVID)  RVPGX2
Influenza A by PCR: NEGATIVE
Influenza B by PCR: NEGATIVE
Resp Syncytial Virus by PCR: NEGATIVE
SARS Coronavirus 2 by RT PCR: NEGATIVE

## 2023-01-24 MED ORDER — IBUPROFEN 600 MG PO TABS: 600.0000 mg | ORAL_TABLET | Freq: Once | ORAL | Status: DC

## 2023-01-24 NOTE — ED Notes (Signed)
Pt verbally aggressive with staff upon informing him that blood work was ordered, Pt states "I'm tired of sitting in here, y'all should have done this when I first came in here". Staff attempts to deescalate Pt without success. Pt can be heard yelling from room. Pt requesting to leave.

## 2023-01-24 NOTE — Discharge Instructions (Signed)
Your COVID/flu/RSV and strep swabs were negative.  You did not wish to stay for any further workup.  Please return if you develop any new, worsening, or change in symptoms or other concerns.

## 2023-01-24 NOTE — ED Triage Notes (Signed)
Presents via EMS from home with fever and body aches   States sxs' started last pm   Was given IBU 800 by EMS PTA

## 2023-01-24 NOTE — ED Provider Notes (Signed)
[facility] Provider Note    Event Date/Time   First MD Initiated Contact with Patient 01/24/23 [phone]     (approximate)   History   Generalized Body Aches   HPI  Cory Chan is a 32 y.o. male with a past medical history of adjustment disorder with depressed mood who presents today for evaluation of body aches, nasal congestion, sneezing, and headache.  He denies any known sick contacts.  Denies chest pain, shortness of breath, abdominal pain, nausea, vomit, diarrhea.  He reports that he has had a slight cough and sore throat.  No recent travel.  No voice change.  Patient denies any recent tick exposures or having been in any location that may have increased his risk for tick exposure.  Patient Active Problem List   Diagnosis Date Noted   Adjustment disorder with depressed mood           Physical Exam   Triage Vital Signs: ED Triage Vitals  Encounter Vitals Group     BP      Systolic BP Percentile      Diastolic BP Percentile      Pulse      Resp      Temp      Temp src      SpO2      Weight      Height      Head Circumference      Peak Flow      Pain Score      Pain Loc      Pain Education      Exclude from Growth Chart     Most recent vital signs: Vitals:   01/24/23 0900  BP: 134/85  Pulse: 84  Resp: 20  Temp: 98.1 F (36.7 C)  SpO2: 98%    Physical Exam Vitals and nursing note reviewed.  Constitutional:      General: Awake and alert. No acute distress.    Appearance: Normal appearance. The patient is normal weight.  HENT:     Head: Normocephalic and atraumatic.     Mouth: Mucous membranes are moist. Uvula midline.  No tonsillar exudate.  No soft palate fluctuance.  No trismus.  No voice change.  No sublingual swelling.  No tender cervical lymphadenopathy.  No nuchal rigidity Eyes:     General: PERRL. Normal EOMs        Right eye: No discharge.        Left eye: No discharge.     Conjunctiva/sclera: Conjunctivae  normal.  Cardiovascular:     Rate and Rhythm: Normal rate and regular rhythm.     Pulses: Normal pulses.  Pulmonary:     Effort: Pulmonary effort is normal. No respiratory distress.  Able to speak easily in complete sentences    Breath sounds: Normal breath sounds.  Abdominal:     Abdomen is soft. There is no abdominal tenderness. No rebound or guarding. No distention. Musculoskeletal:        General: No swelling. Normal range of motion.     Cervical back: Normal range of motion and neck supple.  No meningismus. Skin:    General: Skin is warm and dry.     Capillary Refill: Capillary refill takes less than 2 seconds.     Findings: No rash.  Neurological:     Mental Status: The patient is awake and alert.      ED Results / Procedures / Treatments   Labs (all labs ordered are  listed, but only abnormal results are displayed) Labs Reviewed  RESP PANEL BY RT-PCR (RSV, FLU A&B, COVID)  RVPGX2  GROUP A STREP BY PCR     EKG     RADIOLOGY I independently reviewed and interpreted imaging and agree with radiologists findings.     PROCEDURES:  Critical Care performed:   Procedures   MEDICATIONS ORDERED IN ED: Medications  ibuprofen (ADVIL) tablet 600 mg (600 mg Oral Not Given 01/24/23 0929)     IMPRESSION / MDM / ASSESSMENT AND PLAN / ED COURSE  I reviewed the triage vital signs and the nursing notes.   Differential diagnosis includes, but is not limited to, COVID, flu, strep pharyngitis, other viral syndrome.  Patient is awake and alert, hemodynamically stable and afebrile.  He is nontoxic in appearance.  His uvula is midline, no tonsillar exudate, no trismus, no drooling, no voice change, not consistent with peritonsillar or retropharyngeal abscess.  No meningismus, no altered mental status/encephalopathy, no fever, no neck pain or stiffness, not consistent with meningitis.  He denies urinary symptoms or abdominal pain.  There is no rash on exam.  He denies any recent  possible tick exposure and declines empiric treatment for tickborne illness.  COVID/flu/RSV and strep swabs obtained are negative.  Chest x-ray reveals no acute cardiopulmonary abnormality.  He declines further workup including laboratory testing or urinalysis.  He feels significantly improved after the EMS gave him ibuprofen and he would like to be discharged.  He understands return precautions.  He was discharged in stable condition.   Patient's presentation is most consistent with acute complicated illness / injury requiring diagnostic workup.      FINAL CLINICAL IMPRESSION(S) / ED DIAGNOSES   Final diagnoses:  Viral syndrome     Rx / DC Orders   ED Discharge Orders     None        Note:  This document was prepared using Dragon voice recognition software and may include unintentional dictation errors.   Keturah Shavers 01/24/23 1356    Willy Eddy, MD 01/24/23 [phone]

## 2023-04-09 ENCOUNTER — Other Ambulatory Visit: Payer: Self-pay

## 2023-04-09 DIAGNOSIS — Z202 Contact with and (suspected) exposure to infections with a predominantly sexual mode of transmission: Secondary | ICD-10-CM | POA: Insufficient documentation

## 2023-04-09 NOTE — ED Triage Notes (Signed)
Pt reports sexual contact  with a woman who is positive for Trichomonas. Pt denies symptoms- I.e penile discharge, pain, etc. Pt requesting testing for Trich. Denies known exposure to other STD. Pt ambulatory to triage. Alert and oriented. Breathing unlabored.

## 2023-04-10 ENCOUNTER — Emergency Department
Admission: EM | Admit: 2023-04-10 | Discharge: 2023-04-10 | Disposition: A | Discharge status: Home / Self Care | Payer: Self-pay | Attending: Emergency Medicine | Admitting: Emergency Medicine

## 2023-04-10 DIAGNOSIS — Z202 Contact with and (suspected) exposure to infections with a predominantly sexual mode of transmission: Secondary | ICD-10-CM

## 2023-04-10 LAB — URINALYSIS, ROUTINE W REFLEX MICROSCOPIC
Bilirubin Urine: NEGATIVE
Glucose, UA: NEGATIVE mg/dL
Hgb urine dipstick: NEGATIVE
Ketones, ur: NEGATIVE mg/dL
Leukocytes,Ua: NEGATIVE
Nitrite: NEGATIVE
Protein, ur: NEGATIVE mg/dL
Specific Gravity, Urine: 1.025 (ref 1.005–1.030)
pH: 7 (ref 5.0–8.0)

## 2023-04-10 LAB — CHLAMYDIA/NGC RT PCR (ARMC ONLY)
Chlamydia Tr: NOT DETECTED
N gonorrhoeae: NOT DETECTED

## 2023-04-10 NOTE — ED Notes (Signed)
Pt asks security guard "if I make myself pass out right now, will I be taken to a room right away"? Pt advised not to do that and he will be in a room as soon as we have one available

## 2023-04-10 NOTE — ED Provider Notes (Signed)
[facility] Provider Note    Event Date/Time   First MD Initiated Contact with Patient 04/10/23 0401     (approximate)   History   Exposure to STD   HPI  Cory Chan is a 32 y.o. male   Past medical history of no significant past medical history who presents emergency department for STI testing.  He had sexual intercourse with a partner who disclosed to him that she tested positive for trichomoniasis recently.  He denies any symptoms whatsoever including testicular pain, penile discharge, dysuria or frequency.      Physical Exam   Triage Vital Signs: ED Triage Vitals  Encounter Vitals Group     BP 04/09/23 2354 124/78     Systolic BP Percentile --      Diastolic BP Percentile --      Pulse Rate 04/09/23 2354 76     Resp 04/09/23 2354 18     Temp 04/09/23 2354 97.8 F (36.6 C)     Temp src --      SpO2 04/09/23 2354 100 %     Weight 04/09/23 2353 180 lb (81.6 kg)     Height 04/09/23 2353 5\' 9"  (1.753 m)     Head Circumference --      Peak Flow --      Pain Score 04/09/23 2359 0     Pain Loc --      Pain Education --      Exclude from Growth Chart --     Most recent vital signs: Vitals:   04/09/23 2354  BP: 124/78  Pulse: 76  Resp: 18  Temp: 97.8 F (36.6 C)  SpO2: 100%    General: Awake, no distress.  CV:  Good peripheral perfusion.  Resp:  Normal effort.  Abd:  No distention.  Other:  Awake alert comfortable cooperative, pleasant, normal vital signs nontoxic appearance.   ED Results / Procedures / Treatments   Labs (all labs ordered are listed, but only abnormal results are displayed) Labs Reviewed  URINALYSIS, ROUTINE W REFLEX MICROSCOPIC - Abnormal; Notable for the following components:      Result Value   Color, Urine YELLOW (*)    APPearance CLOUDY (*)    All other components within normal limits  CHLAMYDIA/NGC RT PCR ([facility] ONLY)            URINE CYTOLOGY ANCILLARY ONLY     I ordered and reviewed the above  labs they are notable for urine shows no bacteria or inflammatory changes  PROCEDURES:  Critical Care performed: No  Procedures   MEDICATIONS ORDERED IN ED: Medications - No data to display   IMPRESSION / MDM / ASSESSMENT AND PLAN / ED COURSE  I reviewed the triage vital signs and the nursing notes.                                Patient's presentation is most consistent with acute, uncomplicated illness.  Differential diagnosis includes, but is not limited to, STI exposure   The patient is on the cardiac monitor to evaluate for evidence of arrhythmia and/or significant heart rate changes.  MDM:    Patient completely asymptomatic requesting STI screening tests due to recent exposure.  Testing sent, he will follow-up on MyChart for results and follow-up with PMD return with any new or worsening symptoms.       FINAL CLINICAL IMPRESSION(S) / ED DIAGNOSES  Final diagnoses:  STD exposure     Rx / DC Orders   ED Discharge Orders     None        Note:  This document was prepared using Dragon voice recognition software and may include unintentional dictation errors.    Pilar Jarvis, MD 04/10/23 [phone]

## 2023-04-10 NOTE — Discharge Instructions (Signed)
Check MyChart for the results of your STI testing.  See your doctor or call the emergency department or come back to the emergency department if your testing is positive to start treatment.   Thank you for choosing [place] for your health care today!  Please see your primary doctor this week for a follow up appointment.   If you have any new, worsening, or unexpected symptoms call your doctor right away or come back to the emergency department for reevaluation.  It was my pleasure to care for you today.   Daneil Dan [place] Charon, MD

## 2023-04-21 ENCOUNTER — Ambulatory Visit: Payer: Self-pay
